# Patient Record
Sex: Female | Born: 1995 | Race: Black or African American | Hispanic: No | Marital: Single | Smoking: Never smoker
Health system: Southern US, Community
[De-identification: ages and names within clinical notes are randomized; demographics above are authoritative.]

## PROBLEM LIST (undated history)

## (undated) DIAGNOSIS — J302 Other seasonal allergic rhinitis: Secondary | ICD-10-CM

## (undated) DIAGNOSIS — E669 Obesity, unspecified: Secondary | ICD-10-CM

---
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
## 2017-11-02 ENCOUNTER — Other Ambulatory Visit: Payer: Self-pay

## 2017-11-02 ENCOUNTER — Encounter (HOSPITAL_BASED_OUTPATIENT_CLINIC_OR_DEPARTMENT_OTHER): Payer: Self-pay | Admitting: *Deleted

## 2017-11-02 ENCOUNTER — Emergency Department (HOSPITAL_BASED_OUTPATIENT_CLINIC_OR_DEPARTMENT_OTHER)
Admission: EM | Admit: 2017-11-02 | Discharge: 2017-11-02 | Disposition: A | Discharge status: Home / Self Care | Payer: Self-pay | Attending: Emergency Medicine | Admitting: Emergency Medicine

## 2017-11-02 DIAGNOSIS — R112 Nausea with vomiting, unspecified: Secondary | ICD-10-CM

## 2017-11-02 DIAGNOSIS — Z7722 Contact with and (suspected) exposure to environmental tobacco smoke (acute) (chronic): Secondary | ICD-10-CM | POA: Insufficient documentation

## 2017-11-02 DIAGNOSIS — R197 Diarrhea, unspecified: Secondary | ICD-10-CM

## 2017-11-02 DIAGNOSIS — K529 Noninfective gastroenteritis and colitis, unspecified: Secondary | ICD-10-CM | POA: Insufficient documentation

## 2017-11-02 LAB — URINALYSIS, ROUTINE W REFLEX MICROSCOPIC
BILIRUBIN URINE: NEGATIVE
GLUCOSE, UA: NEGATIVE mg/dL
HGB URINE DIPSTICK: NEGATIVE
Ketones, ur: NEGATIVE mg/dL
NITRITE: POSITIVE — AB
PH: 6 (ref 5.0–8.0)
Protein, ur: 30 mg/dL — AB

## 2017-11-02 LAB — URINALYSIS, MICROSCOPIC (REFLEX)

## 2017-11-02 LAB — PREGNANCY, URINE: Preg Test, Ur: NEGATIVE

## 2017-11-02 MED ORDER — LOPERAMIDE HCL 2 MG PO CAPS: 4.0000 mg | ORAL_CAPSULE | Freq: Every evening | ORAL | 0 refills | Status: DC | PRN

## 2017-11-02 MED ORDER — ONDANSETRON 8 MG PO TBDP
8.0000 mg | ORAL_TABLET | Freq: Once | ORAL | Status: AC
  Administered 2017-11-02: 8 mg via ORAL
  Filled 2017-11-02: qty 1

## 2017-11-02 MED ORDER — ONDANSETRON 4 MG PO TBDP: 4.0000 mg | ORAL_TABLET | Freq: Three times a day (TID) | ORAL | 0 refills | Status: DC | PRN

## 2017-11-02 NOTE — ED Notes (Signed)
C/o NVD, onset 2030, no sick contacts, no known event. Vomiting yellow bile at this time. (Denies fever).   Alert, NAD, calm, interactive, resps e/u, speaking in clear complete sentences, no dyspnea noted, skin W&D, VSS, (denies: pain, sob, dizziness or visual changes). [facility]

## 2017-11-02 NOTE — ED Provider Notes (Signed)
[facility] EMERGENCY DEPARTMENT Provider Note   CSN: [id number] Arrival date & time: 11/02/17  [id number]     History   Chief Complaint Chief Complaint  Patient presents with  . Emesis    HPI Chelsea Newton is a 22 y.o. female.  HPI 22 year old with history of seasonal allergies comes in with chief complaint of diarrhea and vomiting. Patient reports that her symptoms started at 7:30 PM.  Patient has had about 5 episodes of emesis, bilious in nature.  Patient has no associated abdominal pain.  Patient also has had 2 episodes of loose and watery bowel movements.  Patient denies any associated fevers, chills.  UA shows nitrite positive urine with positive WBCs.  Patient denies any dysuria, hematuria, urinary frequency, lower back pain, suprapubic pain, vaginal discharge.  Patient also is not pregnant, and she does not have a history of UTI.  Past Medical History:  Diagnosis Date  . Seasonal allergies     There are no active problems to display for this patient.   History reviewed. No pertinent surgical history.  OB History    No data available       Home Medications    Prior to Admission medications   Medication Sig Start Date End Date Taking? Authorizing Provider  Cetirizine HCl (ZYRTEC PO) Take by mouth.    [provider]  Docusate Sodium 50 MG/15ML LIQD Take as directed on packaging 12/17/14   Gerhard MunchLockwood, Robert, MD  glycerin, Pediatric, 1.2 G SUPP Place 1 suppository (1.2 g total) rectally daily as needed for moderate constipation. 12/17/14   Gerhard MunchLockwood, Robert, MD  ibuprofen (ADVIL,MOTRIN) 600 MG tablet Take 1 tablet (600 mg total) by mouth every 6 (six) hours as needed. 08/31/15   Geoffery Lyonselo, Douglas, MD  loperamide (IMODIUM) 2 MG capsule Take 2 capsules (4 mg total) by mouth at bedtime as needed for diarrhea or loose stools. 11/02/17   Derwood KaplanNanavati, Milam Allbaugh, MD  ondansetron (ZOFRAN ODT) 4 MG disintegrating tablet Take 1 tablet (4 mg total) by mouth every 8 (eight)  hours as needed for nausea or vomiting. 11/02/17   Derwood KaplanNanavati, Jaime Grizzell, MD    Family History History reviewed. No pertinent family history.  Social History Social History   Tobacco Use  . Smoking status: Passive Smoke Exposure - Never Smoker  . Smokeless tobacco: Never Used  Substance Use Topics  . Alcohol use: No  . Drug use: No     Allergies   Patient has no known allergies.   Review of Systems Review of Systems  Constitutional: Negative for fever.  Gastrointestinal: Positive for diarrhea, nausea and vomiting. Negative for abdominal pain.  Allergic/Immunologic: Negative for immunocompromised state.  Neurological: Positive for light-headedness.  All other systems reviewed and are negative.    Physical Exam Updated Vital Signs BP 133/76 (BP Location: Right Arm)   Pulse 98   Temp 98.3 F (36.8 C) (Oral)   Resp 16   Ht 5\' 11"  (1.803 m)   Wt 136.1 kg (300 lb)   LMP 10/14/2017 (Approximate)   SpO2 100%   BMI 41.84 kg/m   Physical Exam  Constitutional: She is oriented to person, place, and time. She appears well-developed.  HENT:  Head: Normocephalic and atraumatic.  Mucous membranes are moist  Eyes: EOM are normal. No scleral icterus.  Neck: Normal range of motion. Neck supple.  Cardiovascular: Normal rate.  Pulmonary/Chest: Effort normal.  Abdominal: Soft. Bowel sounds are normal. There is no tenderness.  Neurological: She is alert and oriented to person,  place, and time.  Skin: Skin is warm and dry.  Nursing note and vitals reviewed.    ED Treatments / Results  Labs (all labs ordered are listed, but only abnormal results are displayed) Labs Reviewed  URINALYSIS, ROUTINE W REFLEX MICROSCOPIC - Abnormal; Notable for the following components:      Result Value   Specific Gravity, Urine >1.030 (*)    Protein, ur 30 (*)    Nitrite POSITIVE (*)    Leukocytes, UA TRACE (*)    All other components within normal limits  URINALYSIS, MICROSCOPIC (REFLEX) -  Abnormal; Notable for the following components:   Bacteria, UA MANY (*)    Squamous Epithelial / LPF 0-5 (*)    All other components within normal limits  PREGNANCY, URINE    EKG  EKG Interpretation None       Radiology No results found.  Procedures Procedures (including critical care time)  Medications Ordered in ED Medications  ondansetron (ZOFRAN-ODT) disintegrating tablet 8 mg (8 mg Oral Given 11/02/17 0742)     Initial Impression / Assessment and Plan / ED Course  I have reviewed the triage vital signs and the nursing notes.  Pertinent labs & imaging results that were available during my care of the patient were reviewed by me and considered in my medical decision making (see chart for details).  Clinical Course as of Nov 02 910  Wynelle Link Nov 02, 2017  1191 Pt reassessed. Pt's VSS and WNL. Pt's cap refill < 3 seconds. Pt has been hydrated in the ER and now passed po challenge. She only attempted to drink 2-3 sips. We will discharge with antiemetic. Strict ER return precautions have been discussed and pt will return if he is unable to tolerate fluids and symptoms are getting worse.   [AN]    Clinical Course User Index [AN] Derwood Kaplan, MD    Patient comes in with chief complaint of vomiting, diarrhea.  Patient has no associated abdominal pain.   Differential diagnosis includes UTI, small bowel obstruction, gastroenteritis, cholelithiasis, small bowel obstruction, pyelonephritis.  Patient is not appearing severely dehydrated.  There is no abdominal tenderness appreciated on exam.  We will start oral challenge.  Patient's urine also shows positive WBCs and nitrites.  However patient does not have any UTI-like symptoms, therefore we will not treat her for asymptomatic bacteriuria.  Strict return precautions for UTI/pyelonephritis have been discussed.  Final Clinical Impressions(s) / ED Diagnoses   Final diagnoses:  Nausea vomiting and diarrhea  Gastroenteritis     ED Discharge Orders        Ordered    ondansetron (ZOFRAN ODT) 4 MG disintegrating tablet  Every 8 hours PRN     11/02/17 0851    loperamide (IMODIUM) 2 MG capsule  At bedtime PRN     11/02/17 0851       Derwood Kaplan, MD 11/02/17 4782

## 2017-11-02 NOTE — Discharge Instructions (Signed)
Please hydrate well. Clear liquid diet recommended for the first 2 days, followed by soft diet with past and soup.  Start solid food in 4 days, if you are tolerating the clear liquid and a soft diet.  Take nausea medicines as prescribed, take diarrhea medicine only at nighttime if you are having nighttime diarrhea. Please return to the ER if your symptoms worsen; you have increased pain, fevers, chills, inability to keep any medications down, confusion.  As discussed, urine does show some signs of infection, however you have no urinary tract infection-like symptoms, therefore we will not be treating you for UTI.  However if you start having burning with urination, blood in the urine, or frequent urination, lower quadrant abdominal pain or back pain come back to the emergency room.

## 2017-11-03 ENCOUNTER — Emergency Department (HOSPITAL_BASED_OUTPATIENT_CLINIC_OR_DEPARTMENT_OTHER)
Admission: EM | Admit: 2017-11-03 | Discharge: 2017-11-03 | Disposition: A | Discharge status: Home / Self Care | Payer: Self-pay | Attending: Emergency Medicine | Admitting: Emergency Medicine

## 2017-11-03 ENCOUNTER — Other Ambulatory Visit: Payer: Self-pay

## 2017-11-03 ENCOUNTER — Encounter (HOSPITAL_BASED_OUTPATIENT_CLINIC_OR_DEPARTMENT_OTHER): Payer: Self-pay

## 2017-11-03 DIAGNOSIS — R112 Nausea with vomiting, unspecified: Secondary | ICD-10-CM | POA: Insufficient documentation

## 2017-11-03 DIAGNOSIS — Z5321 Procedure and treatment not carried out due to patient leaving prior to being seen by health care provider: Secondary | ICD-10-CM | POA: Insufficient documentation

## 2017-11-03 NOTE — ED Triage Notes (Signed)
C/o abd pain n/v/d started 2 days ago-seen here yesterday for same-NAD-steady gait

## 2018-02-16 ENCOUNTER — Emergency Department (HOSPITAL_BASED_OUTPATIENT_CLINIC_OR_DEPARTMENT_OTHER)
Admission: EM | Admit: 2018-02-16 | Discharge: 2018-02-16 | Disposition: A | Discharge status: Home / Self Care | Payer: Self-pay | Attending: Emergency Medicine | Admitting: Emergency Medicine

## 2018-02-16 ENCOUNTER — Other Ambulatory Visit: Payer: Self-pay

## 2018-02-16 ENCOUNTER — Encounter (HOSPITAL_BASED_OUTPATIENT_CLINIC_OR_DEPARTMENT_OTHER): Payer: Self-pay | Admitting: Emergency Medicine

## 2018-02-16 DIAGNOSIS — B9789 Other viral agents as the cause of diseases classified elsewhere: Secondary | ICD-10-CM | POA: Insufficient documentation

## 2018-02-16 DIAGNOSIS — J069 Acute upper respiratory infection, unspecified: Secondary | ICD-10-CM | POA: Insufficient documentation

## 2018-02-16 LAB — RAPID STREP SCREEN (MED CTR MEBANE ONLY): STREPTOCOCCUS, GROUP A SCREEN (DIRECT): NEGATIVE

## 2018-02-16 MED ORDER — ACETAMINOPHEN 325 MG PO TABS
650.0000 mg | ORAL_TABLET | Freq: Once | ORAL | Status: AC
  Administered 2018-02-16: 650 mg via ORAL

## 2018-02-16 MED ORDER — ACETAMINOPHEN 325 MG PO TABS
ORAL_TABLET | ORAL | Status: AC
  Filled 2018-02-16: qty 2

## 2018-02-16 NOTE — ED Notes (Signed)
ED Provider at bedside. 

## 2018-02-16 NOTE — ED Triage Notes (Signed)
Pt c/o URI symptoms ongoing for a few days. Denies fever.

## 2018-02-16 NOTE — ED Provider Notes (Signed)
[facility] EMERGENCY DEPARTMENT Provider Note   CSN: [id number] Arrival date & time: 02/16/18  1751     History   Chief Complaint Chief Complaint  Patient presents with  . URI  . Sore Throat    HPI Chelsea Newton is a 22 y.o. female.  The history is provided by the patient and medical records. No language interpreter was used.  URI   Associated symptoms include congestion, sore throat and cough. Pertinent negatives include no chest pain, no abdominal pain, no diarrhea, no nausea, no vomiting and no ear pain.  Sore Throat  Pertinent negatives include no chest pain, no abdominal pain and no shortness of breath.  Chelsea Newton is a 22 y.o. female  with a PMH of seasonal allergies who presents to the Emergency Department complaining of persistent sore throat, nasal congestion and intermittent dry cough x 3 days. She has tried Tylenol cold and flu as well as cough lozenges with some improvement. No aggravating factors noted. Denies sick contacts, but does report [place] a friend off at the airport and believes she caught something while inside. No fever, chills, shortness of breath.    Past Medical History:  Diagnosis Date  . Seasonal allergies     There are no active problems to display for this patient.   History reviewed. No pertinent surgical history.   OB History   None      Home Medications    Prior to Admission medications   Medication Sig Start Date End Date Taking? Authorizing Provider  Cetirizine HCl (ZYRTEC PO) Take by mouth.    [provider]  Docusate Sodium 50 MG/15ML LIQD Take as directed on packaging 12/17/14   Gerhard Munch, MD  glycerin, Pediatric, 1.2 G SUPP Place 1 suppository (1.2 g total) rectally daily as needed for moderate constipation. 12/17/14   Gerhard Munch, MD  ibuprofen (ADVIL,MOTRIN) 600 MG tablet Take 1 tablet (600 mg total) by mouth every 6 (six) hours as needed. 08/31/15   Geoffery Lyons, MD  loperamide  (IMODIUM) 2 MG capsule Take 2 capsules (4 mg total) by mouth at bedtime as needed for diarrhea or loose stools. 11/02/17   Derwood Kaplan, MD  ondansetron (ZOFRAN ODT) 4 MG disintegrating tablet Take 1 tablet (4 mg total) by mouth every 8 (eight) hours as needed for nausea or vomiting. 11/02/17   Derwood Kaplan, MD    Family History No family history on file.  Social History Social History   Tobacco Use  . Smoking status: Never Smoker  . Smokeless tobacco: Never Used  Substance Use Topics  . Alcohol use: No  . Drug use: No     Allergies   Patient has no known allergies.   Review of Systems Review of Systems  Constitutional: Negative for chills and fever.  HENT: Positive for congestion and sore throat. Negative for ear discharge, ear pain and trouble swallowing.   Respiratory: Positive for cough. Negative for shortness of breath.   Cardiovascular: Negative for chest pain.  Gastrointestinal: Negative for abdominal pain, diarrhea, nausea and vomiting.     Physical Exam Updated Vital Signs BP (!) 142/99   Pulse 99   Temp 99.1 F (37.3 C) (Oral)   Resp 16   Ht 5\' 11"  (1.803 m)   Wt (!) 138.3 kg (305 lb)   LMP 01/30/2018   SpO2 99%   BMI 42.54 kg/m   Physical Exam  Constitutional: She is oriented to person, place, and time. She appears well-developed and well-nourished. No  distress.  HENT:  Head: Normocephalic and atraumatic.  OP with erythema, no exudates or tonsillar hypertrophy. + nasal congestion with mucosal edema.   Neck: Normal range of motion. Neck supple.  No meningeal signs.   Cardiovascular: Normal rate, regular rhythm and normal heart sounds.  Pulmonary/Chest: Effort normal.  Lungs are clear to auscultation bilaterally - no w/r/r  Abdominal: Soft. She exhibits no distension. There is no tenderness.  Musculoskeletal: Normal range of motion.  Neurological: She is alert and oriented to person, place, and time.  Skin: Skin is warm and dry. She is not  diaphoretic.  Nursing note and vitals reviewed.    ED Treatments / Results  Labs (all labs ordered are listed, but only abnormal results are displayed) Labs Reviewed  RAPID STREP SCREEN ([facility])  CULTURE, GROUP A STREP [facility])    EKG None  Radiology No results found.  Procedures Procedures (including critical care time)  Medications Ordered in ED Medications  acetaminophen (TYLENOL) 325 MG tablet (has no administration in time range)  acetaminophen (TYLENOL) tablet 650 mg (650 mg Oral Given 02/16/18 1851)     Initial Impression / Assessment and Plan / ED Course  I have reviewed the triage vital signs and the nursing notes.  Pertinent labs & imaging results that were available during my care of the patient were reviewed by me and considered in my medical decision making (see chart for details).     Chelsea GivensDarricka Newton is a 22 y.o. female who presents to ED for cough, congestion, sore throat.   On exam, patient is afebrile, non-toxic appearing with a clear lung exam. Mild rhinorrhea and OP with erythema but no exudates or tonsillar hypertrophy.  Rapid strep negative.  Sxs today likely due to viral URI.Symptomatic home care instructions discussed. PCP follow up strongly encouraged if symptoms persist. Reasons to return to ER discussed. All questions answered.   Blood pressure (!) 142/99, pulse 99, temperature 99.1 F (37.3 C), temperature source Oral, resp. rate 16, height 5\' 11"  (1.803 m), weight (!) 138.3 kg (305 lb), last menstrual period 01/30/2018, SpO2 99 %.   Final Clinical Impressions(s) / ED Diagnoses   Final diagnoses:  Viral URI with cough    ED Discharge Orders    None       Ward, Chase PicketJaime Pilcher, PA-C 02/16/18 1916    Pricilla LovelessGoldston, Scott, MD 02/17/18 1004

## 2018-02-16 NOTE — Discharge Instructions (Signed)
It was my pleasure taking care of you today!   Your symptoms are likely due to a viral upper respiratory infection. Fortunately, we did not see evidence of serious infection and can treat your symptoms. Flonase and mucinex for nasal congestion. Alternate between Tylenol and ibuprofen as needed for body aches / fevers.   Rest, drink plenty of fluids to be sure you are staying hydrated.   Please follow up with your primary doctor for discussion of your diagnoses and further evaluation after today's visit if symptoms persist longer than 7 days; Return to the ER for high fevers, difficulty breathing or other concerning symptoms

## 2018-02-19 LAB — CULTURE, GROUP A STREP (THRC)

## 2020-04-07 ENCOUNTER — Emergency Department (HOSPITAL_BASED_OUTPATIENT_CLINIC_OR_DEPARTMENT_OTHER)
Admission: EM | Admit: 2020-04-07 | Discharge: 2020-04-07 | Disposition: A | Discharge status: Home / Self Care | Payer: Self-pay | Attending: Emergency Medicine | Admitting: Emergency Medicine

## 2020-04-07 ENCOUNTER — Other Ambulatory Visit: Payer: Self-pay

## 2020-04-07 ENCOUNTER — Emergency Department (HOSPITAL_BASED_OUTPATIENT_CLINIC_OR_DEPARTMENT_OTHER): Payer: Self-pay

## 2020-04-07 ENCOUNTER — Encounter (HOSPITAL_BASED_OUTPATIENT_CLINIC_OR_DEPARTMENT_OTHER): Payer: Self-pay | Admitting: Emergency Medicine

## 2020-04-07 DIAGNOSIS — M546 Pain in thoracic spine: Secondary | ICD-10-CM | POA: Insufficient documentation

## 2020-04-07 DIAGNOSIS — R109 Unspecified abdominal pain: Secondary | ICD-10-CM | POA: Insufficient documentation

## 2020-04-07 DIAGNOSIS — E669 Obesity, unspecified: Secondary | ICD-10-CM | POA: Insufficient documentation

## 2020-04-07 DIAGNOSIS — M545 Low back pain: Secondary | ICD-10-CM | POA: Insufficient documentation

## 2020-04-07 DIAGNOSIS — R0602 Shortness of breath: Secondary | ICD-10-CM | POA: Insufficient documentation

## 2020-04-07 HISTORY — DX: Obesity, unspecified: E66.9

## 2020-04-07 LAB — COMPREHENSIVE METABOLIC PANEL
ALT: 21 U/L (ref 0–44)
AST: 18 U/L (ref 15–41)
Albumin: 3.8 g/dL (ref 3.5–5.0)
Alkaline Phosphatase: 69 U/L (ref 38–126)
Anion gap: 8 (ref 5–15)
BUN: 9 mg/dL (ref 6–20)
CO2: 25 mmol/L (ref 22–32)
Calcium: 8.6 mg/dL — ABNORMAL LOW (ref 8.9–10.3)
Chloride: 105 mmol/L (ref 98–111)
Creatinine, Ser: 0.86 mg/dL (ref 0.44–1.00)
GFR calc Af Amer: >60 mL/min (ref >60)
GFR calc non Af Amer: >60 mL/min (ref >60)
Glucose, Bld: 101 mg/dL — ABNORMAL HIGH (ref 70–99)
Potassium: 3.7 mmol/L (ref 3.5–5.1)
Sodium: 138 mmol/L (ref 135–145)
Total Bilirubin: 0.8 mg/dL (ref 0.3–1.2)
Total Protein: 7.5 g/dL (ref 6.5–8.1)

## 2020-04-07 LAB — URINALYSIS, ROUTINE W REFLEX MICROSCOPIC
Bilirubin Urine: NEGATIVE
Glucose, UA: NEGATIVE mg/dL
Hgb urine dipstick: NEGATIVE
Ketones, ur: NEGATIVE mg/dL
Leukocytes,Ua: NEGATIVE
Nitrite: NEGATIVE
Protein, ur: NEGATIVE mg/dL
Specific Gravity, Urine: >1.03 — ABNORMAL HIGH (ref 1.005–1.030)
pH: 6 (ref 5.0–8.0)

## 2020-04-07 LAB — PREGNANCY, URINE: Preg Test, Ur: NEGATIVE

## 2020-04-07 LAB — CBC WITH DIFFERENTIAL/PLATELET
Abs Immature Granulocytes: 0.02 10*3/uL (ref 0.00–0.07)
Basophils Absolute: 0 10*3/uL (ref 0.0–0.1)
Basophils Relative: 0 %
Eosinophils Absolute: 0 10*3/uL (ref 0.0–0.5)
Eosinophils Relative: 1 %
HCT: 37.3 % (ref 36.0–46.0)
Hemoglobin: 12 g/dL (ref 12.0–15.0)
Immature Granulocytes: 0 %
Lymphocytes Relative: 31 %
Lymphs Abs: 2 10*3/uL (ref 0.7–4.0)
MCH: 27.6 pg (ref 26.0–34.0)
MCHC: 32.2 g/dL (ref 30.0–36.0)
MCV: 85.7 fL (ref 80.0–100.0)
Monocytes Absolute: 0.5 10*3/uL (ref 0.1–1.0)
Monocytes Relative: 8 %
Neutro Abs: 3.9 10*3/uL (ref 1.7–7.7)
Neutrophils Relative %: 60 %
Platelets: 286 10*3/uL (ref 150–400)
RBC: 4.35 MIL/uL (ref 3.87–5.11)
RDW: 14.2 % (ref 11.5–15.5)
WBC: 6.5 10*3/uL (ref 4.0–10.5)
nRBC: 0 % (ref 0.0–0.2)

## 2020-04-07 LAB — TROPONIN I (HIGH SENSITIVITY): Troponin I (High Sensitivity): 2 ng/L (ref <18)

## 2020-04-07 LAB — D-DIMER, QUANTITATIVE: D-Dimer, Quant: <0.27 ug/mL-FEU (ref 0.00–0.50)

## 2020-04-07 LAB — LIPASE, BLOOD: Lipase: 21 U/L (ref 11–51)

## 2020-04-07 MED ORDER — HYDROMORPHONE HCL 1 MG/ML IJ SOLN
1.0000 mg | Freq: Once | INTRAMUSCULAR | Status: AC
Start: 2020-04-07 — End: 2020-04-07
  Administered 2020-04-07: 1 mg via INTRAVENOUS
  Filled 2020-04-07: qty 1

## 2020-04-07 MED ORDER — SODIUM CHLORIDE 0.9 % IV BOLUS
1000.0000 mL | Freq: Once | INTRAVENOUS | Status: AC
  Administered 2020-04-07: 1000 mL via INTRAVENOUS

## 2020-04-07 NOTE — ED Provider Notes (Signed)
[facility] EMERGENCY DEPARTMENT Provider Note   CSN: [id number] Arrival date & time: 04/07/20  0736     History Chief Complaint  Patient presents with  . Chest and right flank pain    Chelsea Newton is a 24 y.o. female.  HPI 24 year old female presents with chest pain and right flank pain.  Both of these started on 6/8.  Waxing and waning pain since.  They both started together but do not seem to be coming back together.  The chest pain feels like a sensation of caving in in the middle of her chest.  Since yesterday the chest pain has not recurred.  However the flank/side pain is still present and woke her up out of sleep.  She has not take anything for the pain.  It does not radiate.  This feels more like a severe cramp.  No vomiting.  Sometimes it feels like she has a hard time catching her breath but no real pleuritic pain.  No fevers or abdominal pain.  No diarrhea or urinary symptoms.  Last menstrual cycle in the middle of May. No calf swelling or pain.  Past Medical History:  Diagnosis Date  . Obesity   . Seasonal allergies     There are no problems to display for this patient.   History reviewed. No pertinent surgical history.   OB History   No obstetric history on file.     No family history on file.  Social History   Tobacco Use  . Smoking status: Never Smoker  . Smokeless tobacco: Never Used  Substance Use Topics  . Alcohol use: No  . Drug use: No    Home Medications Prior to Admission medications   Not on File    Allergies    Patient has no known allergies.  Review of Systems   Review of Systems  Constitutional: Negative for fever.  Respiratory: Positive for shortness of breath.   Cardiovascular: Positive for chest pain.  Gastrointestinal: Negative for abdominal pain, diarrhea and vomiting.  Genitourinary: Positive for flank pain. Negative for dysuria and hematuria.  Musculoskeletal: Positive for back pain.  All other systems  reviewed and are negative.   Physical Exam Updated Vital Signs BP 114/74 (BP Location: Left Arm)   Pulse 85   Temp 98 F (36.7 C)   Resp 16   Ht 5\' 11"  (1.803 m)   Wt (!) 141.9 kg   LMP 03/13/2020   SpO2 100%   BMI 43.63 kg/m   Physical Exam Vitals and nursing note reviewed.  Constitutional:      Appearance: She is well-developed. She is obese.  HENT:     Head: Normocephalic and atraumatic.     Right Ear: External ear normal.     Left Ear: External ear normal.     Nose: Nose normal.  Eyes:     General:        Right eye: No discharge.        Left eye: No discharge.  Cardiovascular:     Rate and Rhythm: Normal rate and regular rhythm.     Heart sounds: Normal heart sounds.  Pulmonary:     Effort: Pulmonary effort is normal.     Breath sounds: Normal breath sounds.  Abdominal:     Palpations: Abdomen is soft.     Tenderness: There is no abdominal tenderness.  Musculoskeletal:     Thoracic back: Tenderness present.       Back:     Comments: Diffuse  tenderness near CVA. No rash/skin changes  Skin:    General: Skin is warm and dry.  Neurological:     Mental Status: She is alert.  Psychiatric:        Mood and Affect: Mood is not anxious.     ED Results / Procedures / Treatments   Labs (all labs ordered are listed, but only abnormal results are displayed) Labs Reviewed  URINALYSIS, ROUTINE W REFLEX MICROSCOPIC - Abnormal; Notable for the following components:      Result Value   APPearance HAZY (*)    Specific Gravity, Urine >1.030 (*)    All other components within normal limits  COMPREHENSIVE METABOLIC PANEL - Abnormal; Notable for the following components:   Glucose, Bld 101 (*)    Calcium 8.6 (*)    All other components within normal limits  PREGNANCY, URINE  LIPASE, BLOOD  CBC WITH DIFFERENTIAL/PLATELET  D-DIMER, QUANTITATIVE (NOT AT [facility])  TROPONIN I (HIGH SENSITIVITY)    EKG EKG Interpretation  Date/Time:  Friday April 07 2020 07:58:59  EDT Ventricular Rate:  77 PR Interval:    QRS Duration: 95 QT Interval:  383 QTC Calculation: 434 R Axis:   47 Text Interpretation: Sinus rhythm no acute ST/T changes No old tracing to compare Confirmed by Sherwood Gambler [phone]) on 04/07/2020 8:11:40 AM   Radiology DG Chest 2 View  Result Date: 04/07/2020 CLINICAL DATA:  24 year old female with history of chest pain and right-sided flank pain. Shortness of breath. EXAM: CHEST - 2 VIEW COMPARISON:  Chest x-ray 05/25/2018. FINDINGS: Lung volumes are normal. No consolidative airspace disease. No pleural effusions. No pneumothorax. No pulmonary nodule or mass noted. Pulmonary vasculature and the cardiomediastinal silhouette are within normal limits. IMPRESSION: No radiographic evidence of acute cardiopulmonary disease. Electronically Signed   By: Vinnie Langton M.D.   On: 04/07/2020 08:54    Procedures Procedures (including critical care time)  Medications Ordered in ED Medications  sodium chloride 0.9 % bolus 1,000 mL ( Intravenous Stopped 04/07/20 0952)  HYDROmorphone (DILAUDID) injection 1 mg (1 mg Intravenous Given 04/07/20 0843)    ED Course  I have reviewed the triage vital signs and the nursing notes.  Pertinent labs & imaging results that were available during my care of the patient were reviewed by me and considered in my medical decision making (see chart for details).    MDM Rules/Calculators/A&P                          Patient has some mild right-sided back tenderness.  Otherwise her exam is fairly unremarkable.  Given the location, in between thoracic and abdominal, broad work-up obtained including troponin, D-dimer, labs and urine.  These are all unremarkable.  There is no blood in her urine.  There is no radicular pain and I think ureteral colic is unlikely.  Low risk for PE and with negative D-dimer I think this is unlikely.  Doubt ACS.  Probably given positional changes and point tenderness this is a muscular problem.   Advised NSAIDs and supportive care.  I think given low suspicion for an acute intrathoracic or intra-abdominal emergency, I do not think CTs are warranted at this time but if her pain worsens or does not improve this may be indicated in the future. Final Clinical Impression(s) / ED Diagnoses Final diagnoses:  Acute right-sided thoracic back pain    Rx / DC Orders ED Discharge Orders    None  Pricilla Loveless, MD 04/07/20 1444

## 2020-04-07 NOTE — Discharge Instructions (Signed)
If you develop worsening, recurrent, or continued back pain, numbness or weakness in the legs, incontinence of your bowels or bladders, numbness of your buttocks, fever, abdominal pain, or any other new/concerning symptoms then return to the ER for evaluation.  

## 2020-04-07 NOTE — ED Triage Notes (Signed)
For the past 3 days pt has been having intermittent Chest pain, SOB, and Right flank pain.  Denies any urinary sx.

## 2020-04-07 NOTE — ED Notes (Signed)
Pt unable to urine at this time. Is aware that we need a urine sample.

## 2021-03-14 ENCOUNTER — Other Ambulatory Visit: Payer: Self-pay

## 2021-03-14 ENCOUNTER — Emergency Department (HOSPITAL_BASED_OUTPATIENT_CLINIC_OR_DEPARTMENT_OTHER)
Admission: EM | Admit: 2021-03-14 | Discharge: 2021-03-15 | Disposition: A | Discharge status: Home / Self Care | Payer: Medicaid Other | Attending: Emergency Medicine | Admitting: Emergency Medicine

## 2021-03-14 ENCOUNTER — Encounter (HOSPITAL_BASED_OUTPATIENT_CLINIC_OR_DEPARTMENT_OTHER): Payer: Self-pay | Admitting: *Deleted

## 2021-03-14 DIAGNOSIS — R21 Rash and other nonspecific skin eruption: Secondary | ICD-10-CM | POA: Insufficient documentation

## 2021-03-14 DIAGNOSIS — M25532 Pain in left wrist: Secondary | ICD-10-CM | POA: Insufficient documentation

## 2021-03-14 DIAGNOSIS — Z5321 Procedure and treatment not carried out due to patient leaving prior to being seen by health care provider: Secondary | ICD-10-CM | POA: Insufficient documentation

## 2021-03-14 NOTE — ED Triage Notes (Signed)
C/o left wrist pain x 1 day , denies injury , also c/o  Peeling rash to hands and feet

## 2021-04-26 ENCOUNTER — Encounter (HOSPITAL_BASED_OUTPATIENT_CLINIC_OR_DEPARTMENT_OTHER): Payer: Self-pay

## 2021-04-26 ENCOUNTER — Other Ambulatory Visit (HOSPITAL_BASED_OUTPATIENT_CLINIC_OR_DEPARTMENT_OTHER): Payer: Self-pay

## 2021-04-26 ENCOUNTER — Other Ambulatory Visit: Payer: Self-pay

## 2021-04-26 ENCOUNTER — Emergency Department (HOSPITAL_BASED_OUTPATIENT_CLINIC_OR_DEPARTMENT_OTHER)
Admission: EM | Admit: 2021-04-26 | Discharge: 2021-04-26 | Disposition: A | Discharge status: Home / Self Care | Payer: Medicaid Other | Attending: Emergency Medicine | Admitting: Emergency Medicine

## 2021-04-26 DIAGNOSIS — J029 Acute pharyngitis, unspecified: Secondary | ICD-10-CM | POA: Diagnosis not present

## 2021-04-26 DIAGNOSIS — R0981 Nasal congestion: Secondary | ICD-10-CM

## 2021-04-26 MED ORDER — PREDNISONE 10 MG PO TABS
ORAL_TABLET | ORAL | 0 refills | Status: AC
  Filled 2021-04-26: qty 26, 8d supply, fill #0

## 2021-04-26 MED ORDER — IBUPROFEN 400 MG PO TABS
600.0000 mg | ORAL_TABLET | Freq: Once | ORAL | Status: AC
  Administered 2021-04-26: 600 mg via ORAL
  Filled 2021-04-26: qty 1

## 2021-04-26 NOTE — Discharge Instructions (Addendum)
General Viral Syndrome Care Instructions:  Your symptoms are likely consistent with a viral illness. Viruses do not require or respond to antibiotics. Treatment is symptomatic care and it is important to note that these symptoms may last for 7-14 days.   Hand washing: Wash your hands throughout the day, but especially before and after touching the face, using the restroom, sneezing, coughing, or touching surfaces that have been coughed or sneezed upon. Hydration: Symptoms of most illnesses will be intensified and complicated by dehydration. Dehydration can also extend the duration of symptoms. Drink plenty of fluids and get plenty of rest. You should be drinking at least half a liter of water an hour to stay hydrated. Electrolyte drinks (ex. Gatorade, Powerade, Pedialyte) are also encouraged. You should be drinking enough fluids to make your urine light yellow, almost clear. If this is not the case, you are not drinking enough water. Please note that some of the treatments indicated below will not be effective if you are not adequately hydrated. Diet: Please concentrate on hydration, however, you may introduce food slowly.  Start with a clear liquid diet, progressed to a full liquid diet, and then bland solids as you are able. Pain or fever: Ibuprofen, Naproxen, or acetaminophen (generic for Tylenol) for pain or fever.  Antiinflammatory medications: Take 600 mg of ibuprofen every 6 hours or 440 mg (over the counter dose) to 500 mg (prescription dose) of naproxen every 12 hours for the next 3 days. After this time, these medications may be used as needed for pain. Take these medications with food to avoid upset stomach. Choose only one of these medications, do not take them together. Acetaminophen (generic for Tylenol): Should you continue to have additional pain while taking the ibuprofen or naproxen, you may add in acetaminophen as needed. Your daily total maximum amount of acetaminophen from all sources  should be limited to 4000mg /day for persons without liver problems, or 2000mg /day for those with liver problems. Prednisone: Take the prednisone, as directed, in its entirety. Zyrtec or Claritin: May add these medication daily to control underlying symptoms of congestion, sneezing, and other signs of allergies.  These medications are available over-the-counter. Generics: Cetirizine (generic for Zyrtec) and loratadine (generic for Claritin). Fluticasone: Use fluticasone (generic for Flonase), as directed, for nasal and sinus congestion.  This medication is available over-the-counter. Congestion: Plain guaifenesin (generic for plain Mucinex) may help relieve congestion. Saline sinus rinses and saline nasal sprays may also help relieve congestion. If you do not have high blood pressure, heart problems, or an allergy to such medications, you may also try phenylephrine or Sudafed. Sore throat: Warm liquids or Chloraseptic spray may help soothe a sore throat. Gargle twice a day with a salt water solution made from a half teaspoon of salt in a cup of warm water.  Follow up: Follow up with a primary care provider within the next two weeks should symptoms fail to resolve. Return: Return to the ED for significantly worsening symptoms, shortness of breath, persistent vomiting, large amounts of blood in stool, or any other major concerns.  For prescription assistance, may try using prescription discount sites or apps, such as goodrx.com

## 2021-04-26 NOTE — ED Provider Notes (Signed)
[facility] EMERGENCY DEPARTMENT Provider Note   CSN: [id number] Arrival date & time: 04/26/21  1534     History Chief Complaint  Patient presents with   URI    Chelsea Newton is a 25 y.o. female.  HPI     Chelsea Newton is a 25 y.o. female, with a history of obesity and seasonal allergies, presenting to the ED with sore throat, nasal congestion, body aches, subjective fever for the last 2 days.  She also endorses left wrist pain for about the last month.  She states this pain mostly occurs with radial-ulnar movement of the wrist.  She denies weakness, numbness, injury, swelling, deformity, hand pain, elbow pain.  Patient adds she is 2 months postpartum.  She is not breast-feeding. Denies nausea, vomiting, diarrhea, chest pain, shortness of breath, cough, dizziness, vision changes, neurologic deficits, abdominal pain, urinary symptoms, or any other complaints.  Past Medical History:  Diagnosis Date   Obesity    Seasonal allergies     There are no problems to display for this patient.   History reviewed. No pertinent surgical history.   OB History   No obstetric history on file.     No family history on file.  Social History   Tobacco Use   Smoking status: Never   Smokeless tobacco: Never  Substance Use Topics   Alcohol use: No   Drug use: No    Home Medications Prior to Admission medications   Medication Sig Start Date End Date Taking? Authorizing Provider  predniSONE (DELTASONE) 10 MG tablet Take 4 tablets (40 mg total) by mouth daily for 5 days, THEN 3 tablets (30 mg total) daily for 1 day, THEN 2 tablets (20 mg total) daily for 1 day, THEN 1 tablet (10 mg total) daily for 1 day. 04/26/21 05/04/21 Yes Rehema Muffley C, PA-C    Allergies    Patient has no known allergies.  Review of Systems   Review of Systems  Constitutional:  Positive for fever. Negative for chills, diaphoresis and unexpected weight change.  HENT:  Positive for congestion and  sore throat. Negative for ear pain, trouble swallowing and voice change.   Eyes:  Negative for visual disturbance.  Respiratory:  Negative for cough, chest tightness and shortness of breath.   Cardiovascular:  Negative for chest pain, palpitations and leg swelling.  Gastrointestinal:  Negative for abdominal pain, constipation, diarrhea, nausea and vomiting.  Genitourinary:  Negative for dysuria and flank pain.  Musculoskeletal:  Positive for myalgias. Negative for back pain, neck pain and neck stiffness.  Skin:  Negative for color change, pallor and rash.  Neurological:  Negative for dizziness, syncope, weakness, light-headedness and numbness.  All other systems reviewed and are negative.  Physical Exam Updated Vital Signs BP (!) 158/104 (BP Location: Left Arm)   Pulse (!) 120   Temp 99.4 F (37.4 C) (Oral)   Resp 20   Ht 5\' 11"  (1.803 m)   Wt (!) 141.5 kg   LMP 04/18/2021   SpO2 97%   BMI 43.52 kg/m   Physical Exam Vitals and nursing note reviewed.  Constitutional:      General: She is not in acute distress.    Appearance: She is well-developed. She is obese. She is not diaphoretic.  HENT:     Head: Normocephalic and atraumatic.     Mouth/Throat:     Mouth: Mucous membranes are moist.     Pharynx: Oropharynx is clear.  Eyes:     Conjunctiva/sclera: Conjunctivae  normal.  Cardiovascular:     Rate and Rhythm: Normal rate and regular rhythm.     Pulses: Normal pulses.          Radial pulses are 2+ on the right side and 2+ on the left side.       Posterior tibial pulses are 2+ on the right side and 2+ on the left side.     Heart sounds: Normal heart sounds.     Comments: Tactile temperature in the extremities appropriate and equal bilaterally. Pulmonary:     Effort: Pulmonary effort is normal. No respiratory distress.     Breath sounds: Normal breath sounds.     Comments: No increased work of breathing.  Speaks in full sentences without difficulty. Abdominal:      Palpations: Abdomen is soft.     Tenderness: There is no abdominal tenderness. There is no guarding.  Musculoskeletal:     Cervical back: Normal range of motion and neck supple. No rigidity or tenderness.     Right lower leg: No edema.     Left lower leg: No edema.     Comments: Patient indicates pain over the distal radial right wrist.  She has no point tenderness in this area.  No anatomical snuffbox tenderness.  No swelling, deformity, instability, color abnormality, increased warmth.  She has full range of motion in her wrist, hand, fingers, and elbow, without noted difficulty.  Lymphadenopathy:     Cervical: No cervical adenopathy.  Skin:    General: Skin is warm and dry.  Neurological:     Mental Status: She is alert.     Comments: Sensation grossly intact to light touch through each of the nerve distributions of the bilateral upper extremities. Abduction and adduction of the fingers intact against resistance. Grip strength equal bilaterally. Supination and pronation intact against resistance. Strength 5/5 through the cardinal directions of the bilateral wrists. Strength 5/5 with flexion and extension of the bilateral elbows. Patient can touch the thumb to each one of the fingertips without difficulty.  Patient can hold the "OK" sign against resistance.  Psychiatric:        Mood and Affect: Mood and affect normal.        Speech: Speech normal.        Behavior: Behavior normal.    ED Results / Procedures / Treatments   Labs (all labs ordered are listed, but only abnormal results are displayed) Labs Reviewed - No data to display  EKG None  Radiology No results found.  Procedures Procedures   Medications Ordered in ED Medications  ibuprofen (ADVIL) tablet 600 mg (600 mg Oral Given 04/26/21 1645)    ED Course  I have reviewed the triage vital signs and the nursing notes.  Pertinent labs & imaging results that were available during my care of the patient were reviewed  by me and considered in my medical decision making (see chart for details).    MDM Rules/Calculators/A&P                          Patient presents with sore throat, body aches, nasal congestion. Patient is nontoxic appearing, afebrile, not tachypneic, not hypotensive, maintains excellent SPO2 on room air, and is in no apparent distress.  Mildly tachycardic, 106 on my evaluation.  Patient has not been drinking as much fluid as she should.  She was counseled on this. I have reviewed the patient's chart to obtain more information.  Her wrist pain has been persistent for a month.  She has full range of motion, no swelling, no increased warmth, no tenderness on exam.  My suspicion for septic arthritis is driving the patient's symptoms is quite low. She has no abdominal pain or GYN symptoms, which lowers my suspicion for postpartum infection. Patient was seen in the emergency department for all of the symptoms late last night at the [facility] location. Her work-up included x-rays of the chest and neck, which were unremarkable. Negative lactic acid, no leukocytosis, the remainder of the CBC and CMP, as well as the UA unremarkable.  Pregnancy test yesterday negative. Mononucleosis, influenza, strep, and COVID testing all negative late last night.  Patient requested a brace for her wrist.  She thinks her tonsils are bigger than normal all the time.  She requests referral to ENT.  Her hypertension was noted and discussed with the patient.  Doubt hypertensive emergency.  She is outside the typical window for postpartum preeclampsia. She will need to follow-up with PCP on this matter.  Resources given.   Final Clinical Impression(s) / ED Diagnoses Final diagnoses:  Sore throat  Nasal congestion    Rx / DC Orders ED Discharge Orders          Ordered    predniSONE (DELTASONE) 10 MG tablet        04/26/21 1653             Anselm Pancoast, PA-C 04/26/21 1654    Virgina Norfolk, DO 04/26/21 1802

## 2021-04-26 NOTE — ED Triage Notes (Signed)
Pt c/o URI sx started yesterday-NAD-steady gait 

## 2022-01-20 IMAGING — CR DG CHEST 2V
2 series · 2 of 2 positions shown · non-contrast
Comparison: Chest x-ray 05/25/2018.

CLINICAL DATA: 24-year-old female with history of chest pain and
right-sided flank pain. Shortness of breath.

EXAM:
CHEST - 2 VIEW

[w chest pa]
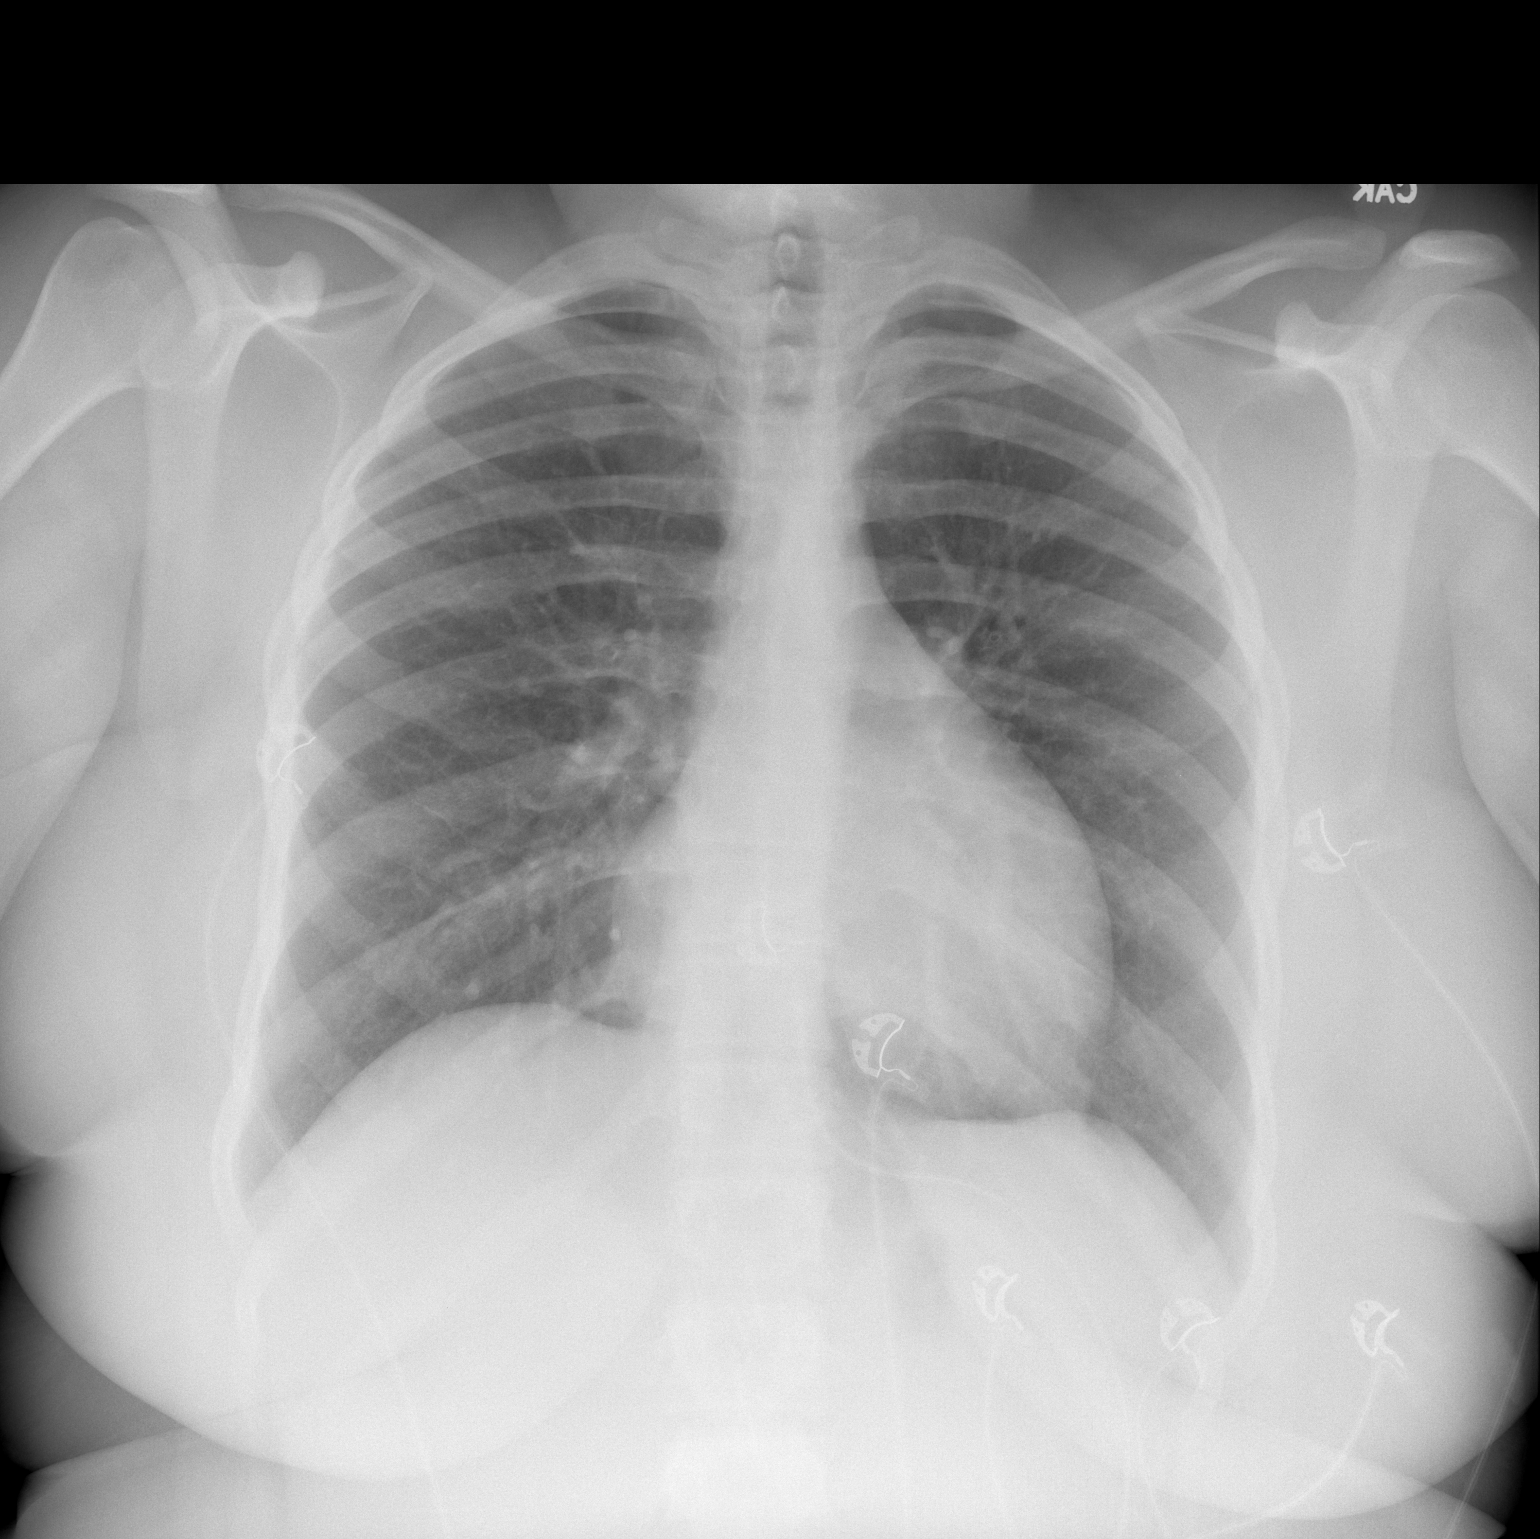

[w chest lat *]
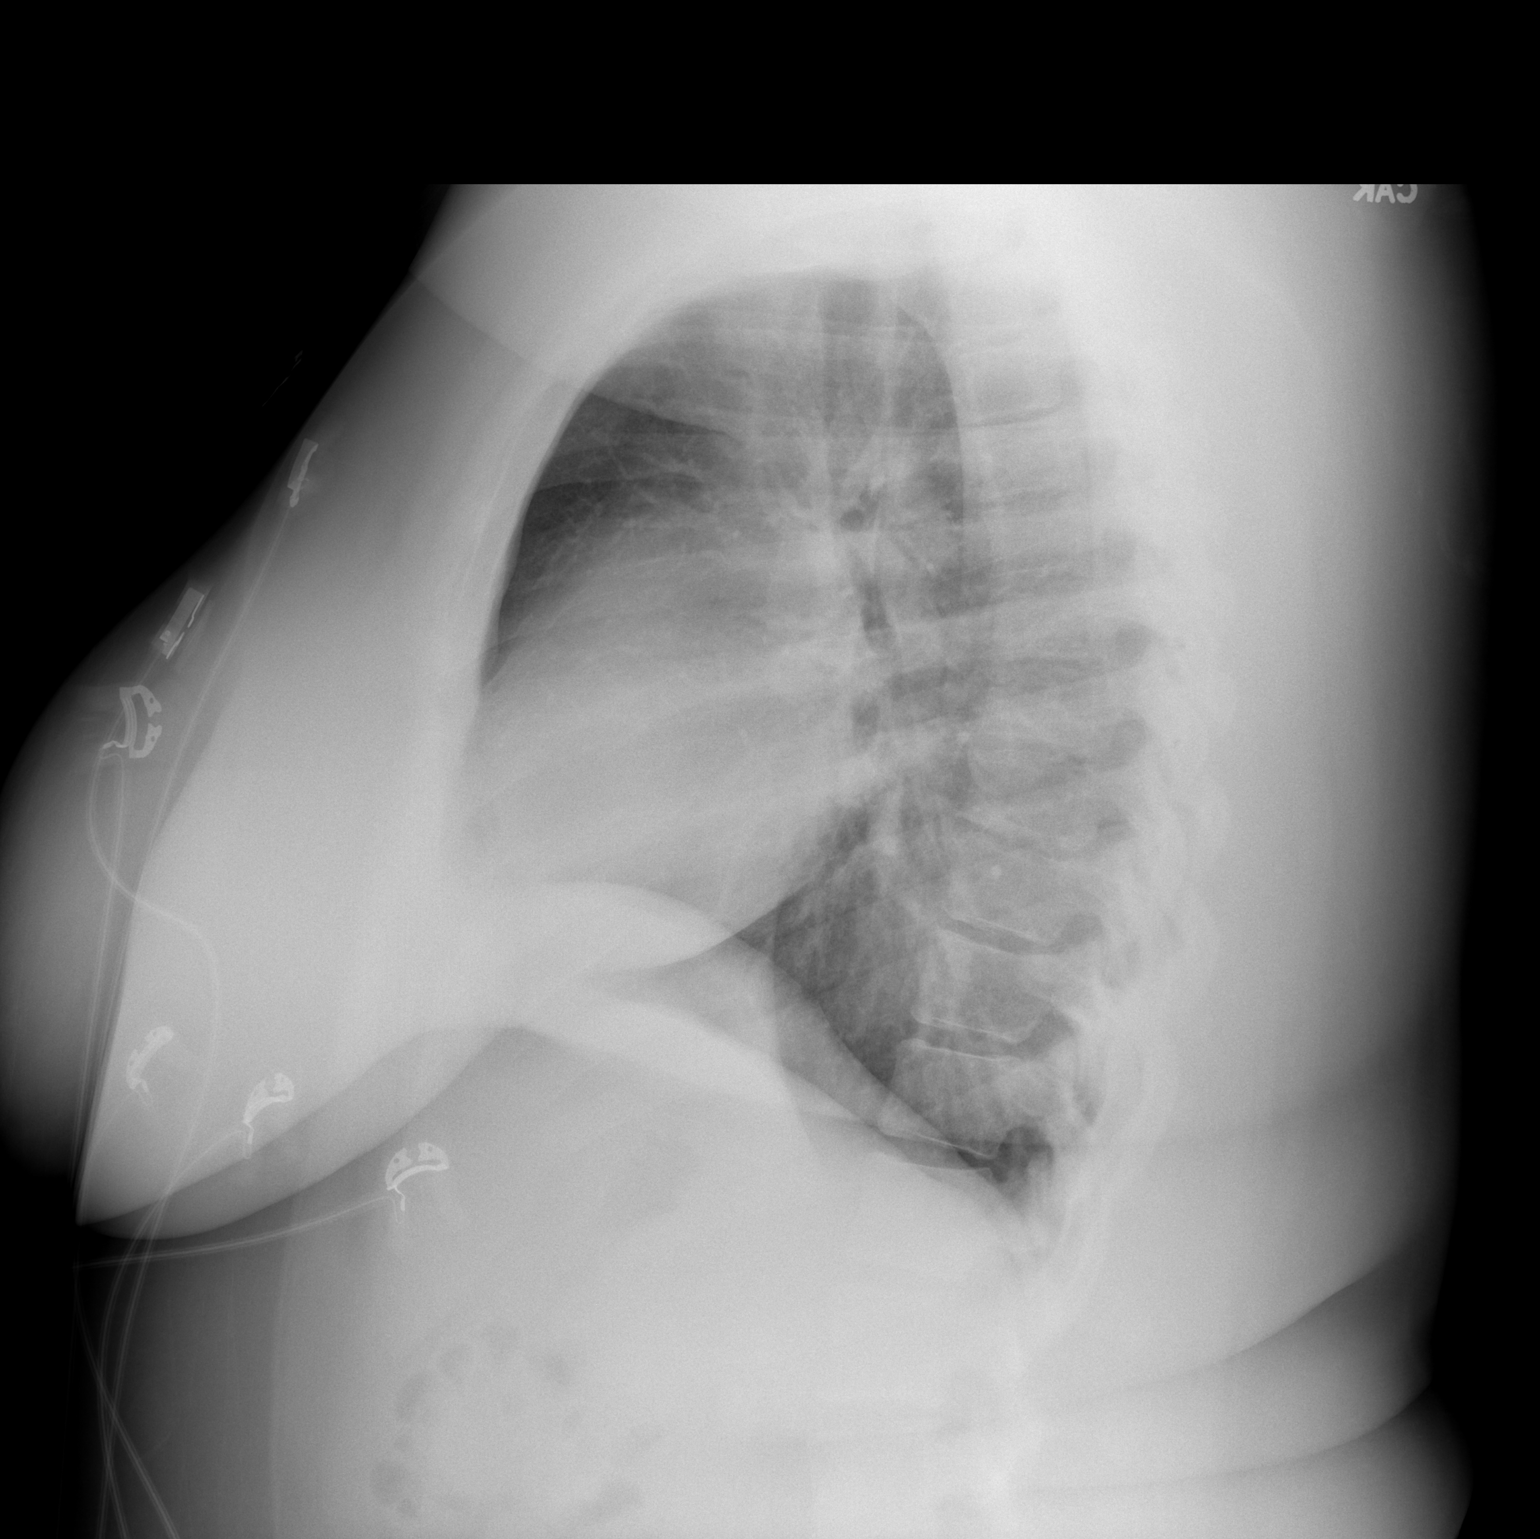

[2 of 2 positions shown; findings below may reference images not displayed]

FINDINGS: Lung volumes are normal. No consolidative airspace disease. No
pleural effusions. No pneumothorax. No pulmonary nodule or mass
noted. Pulmonary vasculature and the cardiomediastinal silhouette
are within normal limits.
IMPRESSION: No radiographic evidence of acute cardiopulmonary disease.

## 2023-01-24 ENCOUNTER — Encounter (HOSPITAL_BASED_OUTPATIENT_CLINIC_OR_DEPARTMENT_OTHER): Payer: Self-pay | Admitting: Emergency Medicine

## 2023-01-24 ENCOUNTER — Other Ambulatory Visit (HOSPITAL_BASED_OUTPATIENT_CLINIC_OR_DEPARTMENT_OTHER): Payer: Self-pay

## 2023-01-24 ENCOUNTER — Other Ambulatory Visit: Payer: Self-pay

## 2023-01-24 ENCOUNTER — Emergency Department (HOSPITAL_BASED_OUTPATIENT_CLINIC_OR_DEPARTMENT_OTHER)
Admission: EM | Admit: 2023-01-24 | Discharge: 2023-01-24 | Disposition: A | Discharge status: Home / Self Care | Payer: Medicaid Other | Attending: Emergency Medicine | Admitting: Emergency Medicine

## 2023-01-24 DIAGNOSIS — J039 Acute tonsillitis, unspecified: Secondary | ICD-10-CM | POA: Insufficient documentation

## 2023-01-24 DIAGNOSIS — Z1152 Encounter for screening for COVID-19: Secondary | ICD-10-CM | POA: Diagnosis not present

## 2023-01-24 DIAGNOSIS — N3001 Acute cystitis with hematuria: Secondary | ICD-10-CM | POA: Insufficient documentation

## 2023-01-24 DIAGNOSIS — K5900 Constipation, unspecified: Secondary | ICD-10-CM | POA: Insufficient documentation

## 2023-01-24 DIAGNOSIS — R300 Dysuria: Secondary | ICD-10-CM | POA: Diagnosis present

## 2023-01-24 LAB — URINALYSIS, ROUTINE W REFLEX MICROSCOPIC
Bilirubin Urine: NEGATIVE
Glucose, UA: 100 mg/dL — AB
Ketones, ur: NEGATIVE mg/dL
Nitrite: POSITIVE — AB
Protein, ur: 100 mg/dL — AB
Specific Gravity, Urine: >=1.03 (ref 1.005–1.030)
pH: 6 (ref 5.0–8.0)

## 2023-01-24 LAB — RESP PANEL BY RT-PCR (RSV, FLU A&B, COVID)  RVPGX2
Influenza A by PCR: NEGATIVE
Influenza B by PCR: NEGATIVE
Resp Syncytial Virus by PCR: NEGATIVE
SARS Coronavirus 2 by RT PCR: NEGATIVE

## 2023-01-24 LAB — URINALYSIS, MICROSCOPIC (REFLEX)

## 2023-01-24 LAB — PREGNANCY, URINE: Preg Test, Ur: NEGATIVE

## 2023-01-24 LAB — GROUP A STREP BY PCR: Group A Strep by PCR: NOT DETECTED

## 2023-01-24 MED ORDER — DEXAMETHASONE 4 MG PO TABS
4.0000 mg | ORAL_TABLET | Freq: Once | ORAL | Status: AC
  Administered 2023-01-24: 4 mg via ORAL
  Filled 2023-01-24: qty 1

## 2023-01-24 MED ORDER — NITROFURANTOIN MONOHYD MACRO 100 MG PO CAPS
100.0000 mg | ORAL_CAPSULE | Freq: Two times a day (BID) | ORAL | 0 refills | Status: DC
  Filled 2023-01-24: qty 10, 5d supply, fill #0

## 2023-01-24 NOTE — ED Triage Notes (Addendum)
Sore throat x 2 days ago , urinary frequency and dysuria x 1 week .  Also co rectal pain , constipation , last BM 5 days ago

## 2023-01-24 NOTE — ED Provider Notes (Signed)
[place] [facility] Provider Note   CSN: [id number] Arrival date & time: 01/24/23  1111     History  Chief Complaint  Patient presents with   Sore Throat   Urinary Frequency    Chelsea Newton is a 27 y.o. female.  Patient with history of morbid obesity presents today with complaints of sore throat, dysuria, and constipation.  She states that her throat has been sore for 1 week and she has had dysuria since last Monday. Also states that she has not had a bowel movement in 5 days which is unusual for her. She has not tried anything for her symptoms. She denies fevers, chills, nausea, vomiting, or abdominal pain. No history of abdominal surgeries. She does state that she was having vaginal discharge and on 3/20 she had STD screening that showed bacterial vaginosis. She has completed treatment for BV with resolution of these symptoms. She was negative for gonorrhea and chlamydia. She is able to swallow without difficulty.   The history is provided by the patient. No language interpreter was used.  Sore Throat  Urinary Frequency       Home Medications Prior to Admission medications   Not on File      Allergies    Patient has no known allergies.    Review of Systems   Review of Systems  HENT:  Positive for sore throat.   Gastrointestinal:  Positive for constipation.  Genitourinary:  Positive for dysuria.  All other systems reviewed and are negative.   Physical Exam Updated Vital Signs BP (!) 142/100 (BP Location: Left Arm)   Pulse (!) 107   Temp (!) 97.5 F (36.4 C) (Oral)   Resp 20   Wt (!) 142.9 kg   LMP 01/03/2023 (Exact Date)   SpO2 100%   BMI 43.93 kg/m  Physical Exam Vitals and nursing note reviewed.  Constitutional:      General: She is not in acute distress.    Appearance: Normal appearance. She is obese. She is not ill-appearing, toxic-appearing or diaphoretic.  HENT:     Head: Normocephalic and atraumatic.      Right Ear: Tympanic membrane and ear canal normal.     Left Ear: Tympanic membrane and ear canal normal.     Mouth/Throat:     Mouth: Mucous membranes are moist.     Pharynx: Uvula midline.     Tonsils: No tonsillar exudate or tonsillar abscesses. 2+ on the right. 2+ on the left.  Eyes:     Conjunctiva/sclera: Conjunctivae normal.     Pupils: Pupils are equal, round, and reactive to light.  Neck:     Comments: No meningismus Cardiovascular:     Rate and Rhythm: Normal rate and regular rhythm.     Heart sounds: Normal heart sounds.  Pulmonary:     Effort: Pulmonary effort is normal. No respiratory distress.     Breath sounds: Normal breath sounds.  Abdominal:     General: Abdomen is flat. Bowel sounds are normal.     Palpations: Abdomen is soft.     Tenderness: There is no abdominal tenderness.  Musculoskeletal:        General: Normal range of motion.     Cervical back: Normal range of motion.  Skin:    General: Skin is warm and dry.  Neurological:     General: No focal deficit present.     Mental Status: She is alert and oriented to person, place, and time.  Psychiatric:        Mood and Affect: Mood normal.        Behavior: Behavior normal.     ED Results / Procedures / Treatments   Labs (all labs ordered are listed, but only abnormal results are displayed) Labs Reviewed  URINALYSIS, ROUTINE W REFLEX MICROSCOPIC - Abnormal; Notable for the following components:      Result Value   Glucose, UA 100 (*)    Hgb urine dipstick LARGE (*)    Protein, ur 100 (*)    Nitrite POSITIVE (*)    Leukocytes,Ua TRACE (*)    All other components within normal limits  URINALYSIS, MICROSCOPIC (REFLEX) - Abnormal; Notable for the following components:   Bacteria, UA MANY (*)    All other components within normal limits  GROUP A STREP BY PCR  RESP PANEL BY RT-PCR (RSV, FLU A&B, COVID)  [id number]  PREGNANCY, URINE    EKG None  Radiology No results found.  Procedures Procedures     Medications Ordered in ED Medications  dexamethasone (DECADRON) tablet 4 mg (4 mg Oral Given 01/24/23 1338)    ED Course/ Medical Decision Making/ A&P                             Medical Decision Making Amount and/or Complexity of Data Reviewed Labs: ordered.  Risk Prescription drug management.   This patient is a 27 y.o. female who presents to the ED for concern of dysuria, sore throat, constipation, this involves an extensive number of treatment options, and is a complaint that carries with it a high risk of complications and morbidity. The emergent differential diagnosis prior to evaluation includes, but is not limited to,  STI, UTI, strep, URI, bowel obstruction, constipation. This is not an exhaustive differential.   Past Medical History / Co-morbidities / Social History: Hx morbid obesity  Additional history: Chart reviewed. Pertinent results include: negative GC/Chlamydia testing on 3/20  Physical Exam: Physical exam performed. The pertinent findings include: tonsillar erythema and swelling without exudate. No signs of PTA or RPA. Abdomen soft and nontender  Lab Tests: I ordered, and personally interpreted labs.  The pertinent results include:  UA infectious, strep negative   Medications: I ordered medication including decadron  for tonsilitis. Reevaluation of the patient after these medicines showed that the patient improved. I have reviewed the patients home medicines and have made adjustments as needed.   Disposition:  Patient presents today with complaints of dysuria, sore throat, and constipation.  She is afebrile, nontoxic-appearing, in no acute distress with reassuring vital signs.  UA is consistent with a UTI, will treat for same with Macrobid.  Patient given Decadron likely for tonsillitis.  No signs of PTA or RPA.  Her abdomen is also soft and nontender and therefore very low suspicion for bowel obstruction or perforation.  Will recommend MiraLAX bowel  regimen for management of constipation.  Instructions given for same. Evaluation and diagnostic testing in the emergency department does not suggest an emergent condition requiring admission or immediate intervention beyond what has been performed at this time.  Plan for discharge with close PCP follow-up.  Patient is understanding and amenable with plan, educated on red flag symptoms that would prompt immediate return.  Patient discharged in stable condition.   Final Clinical Impression(s) / ED Diagnoses Final diagnoses:  Acute cystitis with hematuria  Tonsillitis  Constipation, unspecified constipation type    Rx / DC Orders ED  Discharge Orders          Ordered    nitrofurantoin, macrocrystal-monohydrate, (MACROBID) 100 MG capsule  2 times daily        01/24/23 1416          An After Visit Summary was printed and given to the patient.     Nestor Lewandowsky 01/24/23 1418    Elgie Congo, MD 01/24/23 2040

## 2023-01-24 NOTE — Discharge Instructions (Addendum)
As we discussed, your urine did show signs of infection.  I have given you a prescription for Macrobid which is an antibiotic for you to take as prescribed in its entirety for management of this.  For your constipation, you may do a Miralax bowel cleanout for relief which includes 8 capfulls of Miralax in Gatorade. Drink this in 2 hours on a day where you dont have plans and expect significant bowel movements throughout the day ending in liquid stools.  You may also add an Ex-Lax chocolate and/or a fleets enema as needed for additional assistance with bowel movements.  You may get all of these medications over-the-counter at your local pharmacy when you go to pick up your antibiotic.  You may continue forward with 1 capfull of Miralax twice a day if difficulties with bowel movements continue.   Follow-up with your primary care doctor for any additional health needs.  Return if development of any new or worsening symptoms.

## 2023-03-05 ENCOUNTER — Other Ambulatory Visit: Payer: Self-pay

## 2023-03-05 ENCOUNTER — Emergency Department (HOSPITAL_BASED_OUTPATIENT_CLINIC_OR_DEPARTMENT_OTHER)
Admission: EM | Admit: 2023-03-05 | Discharge: 2023-03-05 | Disposition: A | Discharge status: Home / Self Care | Payer: Medicaid Other | Attending: Emergency Medicine | Admitting: Emergency Medicine

## 2023-03-05 ENCOUNTER — Encounter (HOSPITAL_BASED_OUTPATIENT_CLINIC_OR_DEPARTMENT_OTHER): Payer: Self-pay | Admitting: Emergency Medicine

## 2023-03-05 DIAGNOSIS — J020 Streptococcal pharyngitis: Secondary | ICD-10-CM | POA: Diagnosis not present

## 2023-03-05 DIAGNOSIS — H66003 Acute suppurative otitis media without spontaneous rupture of ear drum, bilateral: Secondary | ICD-10-CM

## 2023-03-05 DIAGNOSIS — Z1152 Encounter for screening for COVID-19: Secondary | ICD-10-CM | POA: Insufficient documentation

## 2023-03-05 DIAGNOSIS — J029 Acute pharyngitis, unspecified: Secondary | ICD-10-CM | POA: Diagnosis present

## 2023-03-05 LAB — GROUP A STREP BY PCR: Group A Strep by PCR: DETECTED — AB

## 2023-03-05 LAB — SARS CORONAVIRUS 2 BY RT PCR: SARS Coronavirus 2 by RT PCR: NEGATIVE

## 2023-03-05 MED ORDER — AMOXICILLIN-POT CLAVULANATE 400-57 MG/5ML PO SUSR: 875.0000 mg | Freq: Two times a day (BID) | ORAL | 0 refills | Status: AC

## 2023-03-05 MED ORDER — IBUPROFEN 100 MG/5ML PO SUSP: 600.0000 mg | Freq: Four times a day (QID) | ORAL | 0 refills | Status: AC | PRN

## 2023-03-05 NOTE — ED Provider Notes (Signed)
[place] [facility]ER[facility]ENC[facility] AT [facility] Provider Note   CSN: [id number] Arrival date & time: 03/05/23  1016     History  Chief Complaint  Patient presents with   Cough   Sore Throat    Chelsea Newton is a 27 y.o. female.  Patient presents to the emergency department for evaluation of 1 week of upper respiratory symptoms.  She began having a sore throat about a week ago.  This then progressed to bilateral ear pain, nasal congestion, cough, body aches.  No documented fever however temperature is 100.4 F on arrival here.  She is been taking over-the-counter medication such as NyQuil without improvement.  No known sick contacts.  No vomiting.  She states that it is very difficult to swallow due to pain in her throat.  She is drinking some fluids.       Home Medications Prior to Admission medications   Medication Sig Start Date End Date Taking? Authorizing Provider  amoxicillin-clavulanate (AUGMENTIN) 400-57 MG/5ML suspension Take 10.9 mLs (872 mg total) by mouth 2 (two) times daily for 10 days. 03/05/23 03/15/23 Yes Renne Crigler, PA-C  ibuprofen (ADVIL) 100 MG/5ML suspension Take 30 mLs (600 mg total) by mouth every 6 (six) hours as needed for fever, mild pain or moderate pain. 03/05/23  Yes Renne Crigler, PA-C      Allergies    Patient has no known allergies.    Review of Systems   Review of Systems  Physical Exam Updated Vital Signs BP (!) 152/87   Pulse (!) 121   Temp (!) 100.4 F (38 C) (Oral)   Resp 18   Ht 5\' 11"  (1.803 m)   Wt (!) 142.9 kg   LMP 02/23/2023   SpO2 99%   BMI 43.93 kg/m  Physical Exam Vitals and nursing note reviewed.  Constitutional:      Appearance: She is well-developed.  HENT:     Head: Normocephalic and atraumatic.     Jaw: No trismus.     Right Ear: Ear canal and external ear normal. Tympanic membrane is injected, erythematous and bulging.     Left Ear: Ear canal and external ear normal. Tympanic membrane is injected,  erythematous and bulging.     Nose: Congestion present. No mucosal edema or rhinorrhea.     Mouth/Throat:     Mouth: Mucous membranes are moist. Mucous membranes are not dry. No oral lesions.     Pharynx: Uvula midline. Oropharyngeal exudate and posterior oropharyngeal erythema present. No uvula swelling.     Tonsils: Tonsillar exudate present. No tonsillar abscesses. 4+ on the right. 4+ on the left.  Eyes:     General:        Right eye: No discharge.        Left eye: No discharge.     Conjunctiva/sclera: Conjunctivae normal.  Cardiovascular:     Rate and Rhythm: Normal rate and regular rhythm.     Heart sounds: Normal heart sounds.  Pulmonary:     Effort: Pulmonary effort is normal. No respiratory distress.     Breath sounds: Normal breath sounds. No wheezing or rales.  Abdominal:     Palpations: Abdomen is soft.     Tenderness: There is no abdominal tenderness.  Musculoskeletal:     Cervical back: Normal range of motion and neck supple.  Lymphadenopathy:     Cervical: No cervical adenopathy.  Skin:    General: Skin is warm and dry.  Neurological:     Mental Status:  She is alert.  Psychiatric:        Mood and Affect: Mood normal.     ED Results / Procedures / Treatments   Labs (all labs ordered are listed, but only abnormal results are displayed) Labs Reviewed  GROUP A STREP BY PCR - Abnormal; Notable for the following components:      Result Value   Group A Strep by PCR DETECTED (*)    All other components within normal limits  SARS CORONAVIRUS 2 BY RT PCR    EKG None  Radiology No results found.  Procedures Procedures    Medications Ordered in ED Medications - No data to display  ED Course/ Medical Decision Making/ A&P    Patient seen and examined. History obtained directly from patient. Work-up including labs, imaging, EKG ordered in triage, if performed, were reviewed.    Labs/EKG: Independently reviewed and interpreted.  This included: Strep test  positive, COVID-negative.  Imaging: None ordered  Medications/Fluids: Offered IM Decadron, she declined stating that she does not want a shot.  She also does not want to swallow pills at this time.  Will prescribe liquid antibiotic and ibuprofen for patient.  Most recent vital signs reviewed and are as follows: BP (!) 152/87   Pulse (!) 121   Temp (!) 100.4 F (38 C) (Oral)   Resp 18   Ht 5\' 11"  (1.803 m)   Wt (!) 142.9 kg   LMP 02/23/2023   SpO2 99%   BMI 43.93 kg/m   Initial impression: Strep pharyngitis, bilateral otitis media  Home treatment plan: Rest, hydration  Return instructions discussed with patient: Inability to swallow, uncontrolled pain, persistent high fever.  Follow-up instructions discussed with patient: Follow-up with PCP in 3 to 5 days if not improving.                            Medical Decision Making Risk Prescription drug management.   Patient with initial sore throat and now more substantial URI symptoms.  She is tested positive for strep.  Her pharyngeal exam is consistent with strep throat, no signs of peritonsillar abscess at this time.  Normal voice.  Full range motion of neck.  Her ears also exhibit signs of infection with bulging TMs, erythema.   In regards to the patient's sore throat today, the following dangerous and potentially life threatening etiologies were considered on the differential diagnosis: Lugwig's angina, uvulitis, epiglottis, peritonsillar abscess, retropharyngeal abscess, Lemierre's syndrome. Also considered were more common causes such as: streptococcal pharyngitis, gonococcal pharyngitis, non-bacterial pharyngitis (cold viruses, HSV/coxsackievirus, influenza, COVID-19, infectious mononucleosis, oropharyngeal candidiasis), and other non-infectious causes including seasonal allergies/post-nasal drip, GERD/esophagitis, trauma.   The patient's vital signs, pertinent lab work and imaging were reviewed and interpreted as discussed in  the ED course. Hospitalization was considered for further testing, treatments, or serial exams/observation. However as patient is well-appearing, has a stable exam, and reassuring studies today, I do not feel that they warrant admission at this time. This plan was discussed with the patient who verbalizes agreement and comfort with this plan and seems reliable and able to return to the Emergency Department with worsening or changing symptoms.          Final Clinical Impression(s) / ED Diagnoses Final diagnoses:  Strep pharyngitis  Non-recurrent acute suppurative otitis media of both ears without spontaneous rupture of tympanic membranes    Rx / DC Orders ED Discharge Orders  Ordered    amoxicillin-clavulanate (AUGMENTIN) 400-57 MG/5ML suspension  2 times daily        03/05/23 1216    ibuprofen (ADVIL) 100 MG/5ML suspension  Every 6 hours PRN        03/05/23 1216              Renne Crigler, PA-C 03/05/23 1221    Lonell Grandchild, MD 03/06/23 [phone]

## 2023-03-05 NOTE — ED Notes (Signed)
RN offered tylenol to patient however patient states she cannot swallow any pills. EDP made aware.

## 2023-03-05 NOTE — ED Triage Notes (Signed)
Patient arrives ambulatory by POV c/o sore throat, body aches, bilateral ear aches and cough since last week. Has been taking nyquil w/o relief.

## 2023-03-05 NOTE — Discharge Instructions (Signed)
Please read and follow all provided instructions.  Your diagnoses today include:  1. Strep pharyngitis   2. Non-recurrent acute suppurative otitis media of both ears without spontaneous rupture of tympanic membranes     Tests performed today include: Strep test: was POSITIVE for strep throat COVID test: Was negative for COVID Vital signs. See below for your results today.   Medications prescribed:  Augmentin - antibiotic  You have been prescribed an antibiotic medicine: take the entire course of medicine even if you are feeling better. Stopping early can cause the antibiotic not to work.  Ibuprofen (Motrin, Advil) - anti-inflammatory pain medication Do not exceed 600mg  ibuprofen every 6 hours, take with food  You have been prescribed an anti-inflammatory medication or NSAID. Take with food. Take smallest effective dose for the shortest duration needed for your pain. Stop taking if you experience stomach pain or vomiting.   Take any medications prescribed only as directed.   Home care instructions:  Please read the educational materials provided and follow any instructions contained in this packet.  Follow-up instructions: Please follow-up with your primary care provider as needed for further evaluation of your symptoms.  Return instructions:  Please return to the Emergency Department if you experience worsening symptoms.  Return if you have worsening problems swallowing, your neck becomes swollen, you cannot swallow your saliva or your voice becomes muffled.  Return with high persistent fever, persistent vomiting, or if you have trouble breathing.  Please return if you have any other emergent concerns.  Additional Information:  Your vital signs today were: BP (!) 152/87   Pulse (!) 121   Temp (!) 100.4 F (38 C) (Oral)   Resp 18   Ht 5\' 11"  (1.803 m)   Wt (!) 142.9 kg   LMP 02/23/2023   SpO2 99%   BMI 43.93 kg/m  If your blood pressure (BP) was elevated above 135/85  this visit, please have this repeated by your doctor within one month. --------------

## 2023-09-30 ENCOUNTER — Encounter (HOSPITAL_BASED_OUTPATIENT_CLINIC_OR_DEPARTMENT_OTHER): Payer: Self-pay | Admitting: Emergency Medicine

## 2023-09-30 ENCOUNTER — Emergency Department (HOSPITAL_BASED_OUTPATIENT_CLINIC_OR_DEPARTMENT_OTHER)
Admission: EM | Admit: 2023-09-30 | Discharge: 2023-09-30 | Disposition: A | Discharge status: Home / Self Care | Payer: Medicaid Other | Attending: Emergency Medicine | Admitting: Emergency Medicine

## 2023-09-30 ENCOUNTER — Emergency Department (HOSPITAL_BASED_OUTPATIENT_CLINIC_OR_DEPARTMENT_OTHER): Payer: Medicaid Other

## 2023-09-30 ENCOUNTER — Other Ambulatory Visit: Payer: Self-pay

## 2023-09-30 DIAGNOSIS — M79605 Pain in left leg: Secondary | ICD-10-CM | POA: Diagnosis present

## 2023-09-30 DIAGNOSIS — M25562 Pain in left knee: Secondary | ICD-10-CM | POA: Insufficient documentation

## 2023-09-30 MED ORDER — NAPROXEN 375 MG PO TABS: 375.0000 mg | ORAL_TABLET | Freq: Two times a day (BID) | ORAL | 0 refills | Status: AC

## 2023-09-30 MED ORDER — ACETAMINOPHEN 325 MG PO TABS
650.0000 mg | ORAL_TABLET | Freq: Once | ORAL | Status: AC
  Administered 2023-09-30: 650 mg via ORAL
  Filled 2023-09-30: qty 2

## 2023-09-30 NOTE — Discharge Instructions (Addendum)
Thank you for letting us evaluate you today. Your XR was negative for fracture or dislocation but has a small collection of fluid which can be due to a ligamentous injury, overuse (which is not visualized on XRs). Please follow up with ortho regarding further management. In meantime, make sure to rest, ice, elevate, and wear knee brace.   Please return to ED if you experience red, hot, swollen leg or knee indicating infection, pale, loss of sensation of leg to indicate poor circulation or clot.

## 2023-09-30 NOTE — ED Notes (Signed)
Discharge instructions reviewed with patient. Patient verbalizes understanding, no further questions at this time. Medications/prescriptions and follow up information provided. No acute distress noted at time of departure.  

## 2023-09-30 NOTE — ED Triage Notes (Signed)
Left sharp leg pain shooting from left knee to lower leg , no injury or fall .  Reports no swelling .

## 2023-09-30 NOTE — ED Provider Notes (Signed)
[place] [facility] AT [facility] Provider Note   CSN: [id number] Arrival date & time: 09/30/23  1137     History  Chief Complaint  Patient presents with   Leg Pain    Chelsea Newton is a 27 y.o. female with no known past medical history presents emergency department for evaluation of left leg pain.  She reports that 2 days ago she noticed that her left leg started hurting causing difficulty with ambulating and standing for long periods of time at her job at [employer]. She does not recall recent injury or precipitating event. She reports that most of the pain starts on the lateral aspect of her left knee but she also has pain in her lower calf and thigh.  She denies previous clots, hemoptysis, recent travel, shortness of breath, chest pain, fevers, complaints prior to leg pain.   Leg Pain Associated symptoms: no fatigue and no fever      Home Medications Prior to Admission medications   Medication Sig Start Date End Date Taking? Authorizing Provider  naproxen (NAPROSYN) 375 MG tablet Take 1 tablet (375 mg total) by mouth 2 (two) times daily for 25 days. 09/30/23 10/25/23 Yes Judithann Sheen, PA  ibuprofen (ADVIL) 100 MG/5ML suspension Take 30 mLs (600 mg total) by mouth every 6 (six) hours as needed for fever, mild pain or moderate pain. 03/05/23   Renne Crigler, PA-C      Allergies    Patient has no known allergies.    Review of Systems   Review of Systems  Constitutional:  Negative for chills, fatigue and fever.  Respiratory:  Negative for cough, chest tightness, shortness of breath and wheezing.   Cardiovascular:  Negative for chest pain and palpitations.  Gastrointestinal:  Negative for abdominal pain, constipation, diarrhea, nausea and vomiting.  Musculoskeletal:  Positive for gait problem and myalgias.  Neurological:  Negative for dizziness, seizures, weakness, light-headedness, numbness and headaches.    Physical Exam Updated Vital Signs BP (!)  150/80   Pulse 79   Temp 97.6 F (36.4 C) (Oral)   Resp 20   Wt (!) 147.4 kg   LMP 09/14/2023   SpO2 100%   BMI 45.33 kg/m  Physical Exam Vitals and nursing note reviewed.  Constitutional:      General: She is not in acute distress.    Appearance: Normal appearance.  HENT:     Head: Normocephalic and atraumatic.  Eyes:     Conjunctiva/sclera: Conjunctivae normal.  Cardiovascular:     Rate and Rhythm: Normal rate.     Comments: Dorsalis pedis 2+ bilaterally Pulmonary:     Effort: Pulmonary effort is normal. No respiratory distress.     Breath sounds: Normal breath sounds.  Musculoskeletal:        General: No swelling or deformity.     Right lower leg: No edema.     Left lower leg: No edema.     Comments: Able to flex and extend knee but has limited ROM with knee extension 2/2 pain TTP of lateral, medial, and popliteal fossa of L knee Calf pain with dorsiflexion of L ankle but no calf TTP No appreciable swelling to LLE  Skin:    General: Skin is warm.     Capillary Refill: Capillary refill takes less than 2 seconds.     Coloration: Skin is not jaundiced or pale.     Findings: No erythema.  Neurological:     Mental Status: She is alert and oriented to  person, place, and time. Mental status is at baseline.     Sensory: No sensory deficit.     Gait: Gait abnormal (Limp and increased weight on RLE d/t pain).    ED Results / Procedures / Treatments   Labs (all labs ordered are listed, but only abnormal results are displayed) Labs Reviewed - No data to display  EKG None  Radiology DG Knee Complete 4 Views Left  Result Date: 09/30/2023 CLINICAL DATA:  Pain x2 days EXAM: LEFT KNEE - COMPLETE 4+ VIEW COMPARISON:  None Available. FINDINGS: No evidence of fracture, dislocation. Small effusion in the suprapatellar bursa. No evidence of arthropathy or other focal bone abnormality. Soft tissues are unremarkable. IMPRESSION: Small effusion. No fracture or other acute findings.  Electronically Signed   By: Corlis Leak M.D.   On: 09/30/2023 12:33    Procedures Procedures    Medications Ordered in ED Medications  acetaminophen (TYLENOL) tablet 650 mg (650 mg Oral Given 09/30/23 1209)    ED Course/ Medical Decision Making/ A&P                                 Medical Decision Making  Patient presents to the ED for concern of LLE pain, this involves an extensive number of treatment options, and is a complaint that carries with it a high risk of complications and morbidity.  The differential diagnosis includes fracture, ligament injury, muscle strain, DVT   Co morbidities that complicate the patient evaluation  BMI 45   Additional history obtained:  Additional history obtained from Nursing and Outside Medical Records   External records from outside source obtained and reviewed including triage RN note   Imaging Studies ordered:  I ordered imaging studies including L knee XR and DVT study  I independently visualized and interpreted imaging which showed small joint effusion but no fracture or dislocation I agree with the radiologist interpretation    Medicines ordered and prescription drug management:  I ordered medication including tylenol  for LLE pain  Reevaluation of the patient after these medicines showed that the patient improved I have reviewed the patients home medicines and have made adjustments as needed    Problem List / ED Course:  L knee pain PE significant for limited and pain with knee extension.  There is no appreciable swelling nor warmth to indicate septic joint or infection X-rays negative for fracture or dislocation.  However, there is a small effusion.  This may be due to ligamentous injury, trauma, overuse Provided Tylenol in ED for pain as requested by patient - reports improvement of pain following Tylenol Will provide prescription of naproxen and provide Ortho follow-up  Reevaluation:  After the interventions noted  above, I reevaluated the patient and found that they have :improved   Social Determinants of Health:  Has PCP and ortho f/u   Dispostion:  After consideration of the diagnostic results and the patients response to treatment, I feel that the patent would benefit from orthopedic follow-up.  Discussed findings, disposition, return to emergency department precautions with patient expresses understanding agrees with plan. Discussed RICE and symptomatic management. Provided work note to limit WB and prolonged standing on LLE. All questions answered to her satisfaction.  Discussed patient presentation, PE, imaging, disposition with Dr. Jacqulyn Bath who agrees with plan.       Final Clinical Impression(s) / ED Diagnoses Final diagnoses:  Acute pain of left knee    Rx /  DC Orders ED Discharge Orders          Ordered    naproxen (NAPROSYN) 375 MG tablet  2 times daily        09/30/23 1250             Judithann Sheen, [place] 09/30/23 1256    Maia Plan, MD 09/30/23 1500

## 2023-12-31 ENCOUNTER — Emergency Department (HOSPITAL_BASED_OUTPATIENT_CLINIC_OR_DEPARTMENT_OTHER)

## 2023-12-31 ENCOUNTER — Emergency Department (HOSPITAL_BASED_OUTPATIENT_CLINIC_OR_DEPARTMENT_OTHER)
Admission: EM | Admit: 2023-12-31 | Discharge: 2023-12-31 | Disposition: A | Discharge status: Home / Self Care | Attending: Emergency Medicine | Admitting: Emergency Medicine

## 2023-12-31 ENCOUNTER — Other Ambulatory Visit: Payer: Self-pay

## 2023-12-31 ENCOUNTER — Encounter (HOSPITAL_BASED_OUTPATIENT_CLINIC_OR_DEPARTMENT_OTHER): Payer: Self-pay | Admitting: Emergency Medicine

## 2023-12-31 DIAGNOSIS — G5621 Lesion of ulnar nerve, right upper limb: Secondary | ICD-10-CM

## 2023-12-31 DIAGNOSIS — M79641 Pain in right hand: Secondary | ICD-10-CM | POA: Diagnosis present

## 2023-12-31 MED ORDER — NAPROXEN 375 MG PO TABS: 375.0000 mg | ORAL_TABLET | Freq: Two times a day (BID) | ORAL | 0 refills | Status: AC

## 2023-12-31 NOTE — ED Triage Notes (Signed)
 C/o r hand pain x 1 week. States it is keeping her from working. Denies injury to area.

## 2023-12-31 NOTE — Discharge Instructions (Signed)
 Follow-up with the orthopedic hand specialist.  Get help right away if you develop clawhand or inability to move the fingers, pale white fingers or blue fingers.

## 2023-12-31 NOTE — ED Provider Notes (Signed)
 [place] [facility] Provider Note   CSN: [id number] Arrival date & time: 12/31/23  1913     History  Chief Complaint  Patient presents with   Hand Pain    Chelsea Newton is a 28 y.o. female who presents with a cc of r hand pain in the 4th and 5th digit. Radiating up the arm. Described as sharp and burning. R hand dominant.   Hand Pain       Home Medications Prior to Admission medications   Medication Sig Start Date End Date Taking? Authorizing Provider  ibuprofen (ADVIL) 100 MG/5ML suspension Take 30 mLs (600 mg total) by mouth every 6 (six) hours as needed for fever, mild pain or moderate pain. 03/05/23   Renne Crigler, PA-C      Allergies    Patient has no known allergies.    Review of Systems   Review of Systems  Physical Exam Updated Vital Signs BP (!) 152/85 (BP Location: Right Arm)   Pulse 82   Temp 98.7 F (37.1 C) (Oral)   Resp 18   Ht 5\' 9"  (1.753 m)   Wt (!) 145.2 kg   SpO2 100%   BMI 47.26 kg/m  Physical Exam Vitals and nursing note reviewed.  Constitutional:      General: She is not in acute distress.    Appearance: She is well-developed. She is not diaphoretic.  HENT:     Head: Normocephalic and atraumatic.     Right Ear: External ear normal.     Left Ear: External ear normal.     Nose: Nose normal.     Mouth/Throat:     Mouth: Mucous membranes are moist.     Pharynx: No oropharyngeal exudate.  Eyes:     General: No scleral icterus.    Extraocular Movements: Extraocular movements intact.     Conjunctiva/sclera: Conjunctivae normal.     Pupils: Pupils are equal, round, and reactive to light.  Neck:     Thyroid: No thyromegaly.     Vascular: No JVD.     Comments: No meningismus Cardiovascular:     Rate and Rhythm: Normal rate and regular rhythm.     Heart sounds: Normal heart sounds. No murmur heard.    No friction rub. No gallop.  Pulmonary:     Effort: Pulmonary effort is normal. No  respiratory distress.     Breath sounds: Normal breath sounds.  Abdominal:     General: Bowel sounds are normal. There is no distension.     Palpations: Abdomen is soft. There is no mass.     Tenderness: There is no abdominal tenderness. There is no guarding.  Musculoskeletal:        General: No tenderness. Normal range of motion.     Cervical back: Normal range of motion and neck supple.     Comments: RUE exam Inspection- No erythema, swelling, atrophy, hypertrophy, abrasions, or lacerations noted. Palpation- No ttp forearm or lateral condyle.compartments soft ROM- Full ROM about the shoulder, elbow, wrist. Strength- 5/5 AIN/PIN/U NV- SILT M/R/U, +2 Radial +2 ulnar pulse  Skin:    General: Skin is warm and dry.     Findings: No rash.  Neurological:     Mental Status: She is alert and oriented to person, place, and time.     Cranial Nerves: No cranial nerve deficit.     Coordination: Coordination normal.     Deep Tendon Reflexes: Reflexes are normal and symmetric.  Comments: Speech is clear and goal oriented, follows commands Major Cranial nerves without deficit, no facial droop Normal strength in upper and lower extremities bilaterally including dorsiflexion and plantar flexion, strong and equal grip strength Sensation normal to light and sharp touch Moves extremities without ataxia, coordination intact Normal finger to nose and rapid alternating movements Neg romberg, no pronator drift Normal gait Normal heel-shin and balance   Psychiatric:        Behavior: Behavior normal.     ED Results / Procedures / Treatments   Labs (all labs ordered are listed, but only abnormal results are displayed) Labs Reviewed - No data to display  EKG None  Radiology No results found.  Procedures Procedures    Medications Ordered in ED Medications - No data to display  ED Course/ Medical Decision Making/ A&P                                 Medical Decision Making Amount  and/or Complexity of Data Reviewed Radiology: ordered.   Patient here with hand pain in the distribution of the ulnar nerve.  Neurovascularly intact.  No signs or symptoms of trigger finger, carpal tunnel syndrome, tenosynovitis or infection.  Suspect ulnar neuropathy versus cubital tunnel syndrome.  Patient placed in ulnar gutter splint.  Anti-inflammatories and will up with hand specialist.  Appropriate for discharge at this time        Final Clinical Impression(s) / ED Diagnoses Final diagnoses:  None    Rx / DC Orders ED Discharge Orders     None         Arthor Captain, PA-C 12/31/23 2031    Virgina Norfolk, DO 12/31/23 2228

## 2024-02-26 ENCOUNTER — Ambulatory Visit
Admission: RE | Admit: 2024-02-26 | Discharge: 2024-02-26 | Disposition: A | Discharge status: Home / Self Care | Payer: Self-pay | Source: Ambulatory Visit

## 2024-02-26 ENCOUNTER — Other Ambulatory Visit: Payer: Self-pay

## 2024-02-26 DIAGNOSIS — T148XXA Other injury of unspecified body region, initial encounter: Secondary | ICD-10-CM

## 2024-05-17 ENCOUNTER — Emergency Department (HOSPITAL_BASED_OUTPATIENT_CLINIC_OR_DEPARTMENT_OTHER)
Admission: EM | Admit: 2024-05-17 | Discharge: 2024-05-17 | Disposition: A | Discharge status: Home / Self Care | Attending: Emergency Medicine | Admitting: Emergency Medicine

## 2024-05-17 ENCOUNTER — Encounter (HOSPITAL_BASED_OUTPATIENT_CLINIC_OR_DEPARTMENT_OTHER): Payer: Self-pay | Admitting: Urology

## 2024-05-17 ENCOUNTER — Other Ambulatory Visit: Payer: Self-pay

## 2024-05-17 ENCOUNTER — Emergency Department (HOSPITAL_BASED_OUTPATIENT_CLINIC_OR_DEPARTMENT_OTHER)

## 2024-05-17 DIAGNOSIS — M778 Other enthesopathies, not elsewhere classified: Secondary | ICD-10-CM | POA: Insufficient documentation

## 2024-05-17 DIAGNOSIS — M79642 Pain in left hand: Secondary | ICD-10-CM | POA: Diagnosis present

## 2024-05-17 DIAGNOSIS — D649 Anemia, unspecified: Secondary | ICD-10-CM | POA: Diagnosis not present

## 2024-05-17 DIAGNOSIS — M779 Enthesopathy, unspecified: Secondary | ICD-10-CM

## 2024-05-17 LAB — BASIC METABOLIC PANEL WITH GFR
Anion gap: 11 (ref 5–15)
BUN: 13 mg/dL (ref 6–20)
CO2: 27 mmol/L (ref 22–32)
Calcium: 9.3 mg/dL (ref 8.9–10.3)
Chloride: 104 mmol/L (ref 98–111)
Creatinine, Ser: 0.96 mg/dL (ref 0.44–1.00)
GFR, Estimated: >60 mL/min (ref >60)
Glucose, Bld: 101 mg/dL — ABNORMAL HIGH (ref 70–99)
Potassium: 3.9 mmol/L (ref 3.5–5.1)
Sodium: 142 mmol/L (ref 135–145)

## 2024-05-17 LAB — PREGNANCY, URINE: Preg Test, Ur: NEGATIVE

## 2024-05-17 LAB — CBC WITH DIFFERENTIAL/PLATELET
Abs Immature Granulocytes: 0.02 K/uL (ref 0.00–0.07)
Basophils Absolute: 0 K/uL (ref 0.0–0.1)
Basophils Relative: 0 %
Eosinophils Absolute: 0.1 K/uL (ref 0.0–0.5)
Eosinophils Relative: 1 %
HCT: 36.5 % (ref 36.0–46.0)
Hemoglobin: 11.9 g/dL — ABNORMAL LOW (ref 12.0–15.0)
Immature Granulocytes: 0 %
Lymphocytes Relative: 38 %
Lymphs Abs: 2.7 K/uL (ref 0.7–4.0)
MCH: 27.6 pg (ref 26.0–34.0)
MCHC: 32.6 g/dL (ref 30.0–36.0)
MCV: 84.7 fL (ref 80.0–100.0)
Monocytes Absolute: 0.5 K/uL (ref 0.1–1.0)
Monocytes Relative: 7 %
Neutro Abs: 3.8 K/uL (ref 1.7–7.7)
Neutrophils Relative %: 54 %
Platelets: 287 K/uL (ref 150–400)
RBC: 4.31 MIL/uL (ref 3.87–5.11)
RDW: 14.7 % (ref 11.5–15.5)
WBC: 7.1 K/uL (ref 4.0–10.5)
nRBC: 0 % (ref 0.0–0.2)

## 2024-05-17 LAB — C-REACTIVE PROTEIN: CRP: 1.3 mg/dL — ABNORMAL HIGH (ref <1.0)

## 2024-05-17 LAB — SEDIMENTATION RATE: Sed Rate: 45 mm/h — ABNORMAL HIGH (ref 0–22)

## 2024-05-17 MED ORDER — IBUPROFEN 800 MG PO TABS
800.0000 mg | ORAL_TABLET | Freq: Once | ORAL | Status: AC
  Administered 2024-05-17: 800 mg via ORAL
  Filled 2024-05-17: qty 1

## 2024-05-17 NOTE — ED Provider Notes (Signed)
  EMERGENCY DEPARTMENT AT [facility] Provider Note   CSN: [id number] Arrival date & time: 05/17/24  2006     Patient presents with: Hand Pain   Chelsea Newton is a 28 y.o. female.    Hand Pain     28 year old female presenting to the emergency department with pain that has been present in her fingers.  The patient is right-handed.  She works at [employer].  She states that she has had no trauma to her fingers.  She developed pain in her distal left ring finger that radiated to her wrist, now endorsing pain in her left pinky finger as well.  No fevers, chills, redness or swelling.  She denies any elbow pain.  Pain does not radiate from her elbow down to her hand.  Pain has been ongoing for the past 2 weeks.  Worse with any attempts at range of motion.  No significant joint swelling.  Prior to Admission medications   Medication Sig Start Date End Date Taking? Authorizing Provider  ibuprofen  (ADVIL ) 100 MG/5ML suspension Take 30 mLs (600 mg total) by mouth every 6 (six) hours as needed for fever, mild pain or moderate pain. 03/05/23   Geiple, Joshua, PA-C  naproxen  (NAPROSYN ) 375 MG tablet Take 1 tablet (375 mg total) by mouth 2 (two) times daily with a meal. 12/31/23   Arloa Chroman, PA-C    Allergies: Patient has no known allergies.    Review of Systems  Musculoskeletal:  Positive for arthralgias.  All other systems reviewed and are negative.   Updated Vital Signs BP (!) 157/93 (BP Location: Right Arm)   Pulse 96   Temp (!) 97.4 F (36.3 C)   Resp 18   Ht 5' 9 (1.753 m)   Wt (!) 145.2 kg   LMP 05/07/2024 (Exact Date)   SpO2 99%   BMI 47.27 kg/m   Physical Exam Vitals and nursing note reviewed.  Constitutional:      General: She is not in acute distress. HENT:     Head: Normocephalic and atraumatic.  Eyes:     Conjunctiva/sclera: Conjunctivae normal.     Pupils: Pupils are equal, round, and reactive to light.  Cardiovascular:     Rate and  Rhythm: Normal rate and regular rhythm.  Pulmonary:     Effort: Pulmonary effort is normal. No respiratory distress.  Abdominal:     General: There is no distension.     Tenderness: There is no guarding.  Musculoskeletal:        General: No deformity or signs of injury.     Cervical back: Neck supple.     Comments: 2+ radial pulses, no tenderness of the metacarpals of the hand, no evidence of deformity or trauma, no redness or swelling.  Pain about the MCP joint of the 4th and 5th digits on the left, pain with range of motion, no fusiform swelling of the digits  Skin:    Findings: No lesion or rash.  Neurological:     General: No focal deficit present.     Mental Status: She is alert. Mental status is at baseline.     (all labs ordered are listed, but only abnormal results are displayed) Labs Reviewed  CBC WITH DIFFERENTIAL/PLATELET - Abnormal; Notable for the following components:      Result Value   Hemoglobin 11.9 (*)    All other components within normal limits  BASIC METABOLIC PANEL WITH GFR - Abnormal; Notable for the following components:   Glucose,  Bld 101 (*)    All other components within normal limits  PREGNANCY, URINE  SEDIMENTATION RATE  C-REACTIVE PROTEIN    EKG: None  Radiology: DG Hand Complete Left Result Date: 05/17/2024 CLINICAL DATA:  pinky and ring finger pain EXAM: LEFT HAND - COMPLETE 3+ VIEW COMPARISON:  None Available. FINDINGS: There is no evidence of fracture or dislocation. There is no evidence of arthropathy or other focal bone abnormality. Soft tissues are unremarkable. IMPRESSION: Negative. Electronically Signed   By: Morgane  Naveau M.D.   On: 05/17/2024 20:49     Procedures   Medications Ordered in the ED  ibuprofen  (ADVIL ) tablet 800 mg (800 mg Oral Given 05/17/24 2040)                                    Medical Decision Making Amount and/or Complexity of Data Reviewed Labs: ordered. Radiology: ordered.  Risk Prescription drug  management.      28 year old female presenting to the emergency department with pain that has been present in her fingers.  The patient is right-handed.  She works at [employer].  She states that she has had no trauma to her fingers.  She developed pain in her distal left ring finger that radiated to her wrist, now endorsing pain in her left pinky finger as well.  No fevers, chills, redness or swelling.  She denies any elbow pain.  Pain does not radiate from her elbow down to her hand.  Pain has been ongoing for the past 2 weeks.  Worse with any attempts at range of motion.  No significant joint swelling.  On arrival, the patient was vitally stable, physical exam revealed 2+ radial pulses, no tenderness of the metacarpals of the hand, no evidence of deformity or trauma, no redness or swelling.  Pain about the MCP joint of the 4th and 5th digits on the left, pain with range of motion, no fusiform swelling of the digits.  Differential diagnosis includes overuse tendinitis or tendinopathy, finger sprain or strain although patient denies any injury, osteoarthritis, inflammatory arthritis, less likely ulnar nerve entrapment syndrome, less likely trigger finger.  X-ray left hand: Negative   Labs: CBC without a leukocytosis, hemoglobin at baseline anemia 11.9, BMP unremarkable, urine pregnancy negative, ESR and CRP pending  Patient administered Motrin  for pain control.  Patient offered a wrist splint but declined, requesting that we buddy tape her fingers which was done.  Patient overall reassuring workup at this time, low concern for septic arthritis, no evidence of traumatic injury to the hand, considered inflammatory arthritis, ESR and CRP pending.  Also considered tendinitis.  Advised NSAIDs for pain control and outpatient PCP follow-up.     Final diagnoses:  Pain of left hand  Tendinitis    ED Discharge Orders          Ordered    AMB referral to sports medicine        05/17/24 2146                Jerrol Agent, MD 05/17/24 2146

## 2024-05-17 NOTE — ED Notes (Signed)
 Pt was instructed of MD orders to wear the wrsit brace as to assist in preventing flexion of the fingers and worsening pain

## 2024-05-17 NOTE — ED Triage Notes (Signed)
 Pt states for 2 weeks has had pain in distal left ring finger  States worse this am and now left pinky finger now hurting and radiating to wrist

## 2024-05-17 NOTE — ED Notes (Signed)
 Arrived to place wrist brace on pt, she notes that she has these in excess at home and would not like one today, also states the wrist brace isnt going to help her fingers, this medic offered to notify MD and request possibly buddy tapping the fingers.  MD aware

## 2024-05-17 NOTE — Discharge Instructions (Addendum)
 Your labs and x-ray imaging were overall reassuring.  Your symptoms could be due to tendinitis affecting the 4th and 5th digits of the left hand.  No evidence for infection at this time.  Recommend rest, ice, elevation of the extremity, NSAIDs for pain control, utilize a splint as needed for comfort, follow-up outpatient with sports medicine for repeat assessment.

## 2024-05-17 NOTE — ED Notes (Signed)
 Pt refused the wrist splint advising she has hundreds of them at home. Requested buddy taping her fingers together. EDP aware and approved.

## 2024-10-12 ENCOUNTER — Emergency Department (HOSPITAL_BASED_OUTPATIENT_CLINIC_OR_DEPARTMENT_OTHER)
Admission: EM | Admit: 2024-10-12 | Discharge: 2024-10-12 | Disposition: A | Discharge status: Home / Self Care | Attending: Emergency Medicine | Admitting: Emergency Medicine

## 2024-10-12 ENCOUNTER — Encounter (HOSPITAL_BASED_OUTPATIENT_CLINIC_OR_DEPARTMENT_OTHER): Payer: Self-pay

## 2024-10-12 ENCOUNTER — Other Ambulatory Visit: Payer: Self-pay

## 2024-10-12 ENCOUNTER — Emergency Department (HOSPITAL_BASED_OUTPATIENT_CLINIC_OR_DEPARTMENT_OTHER)

## 2024-10-12 DIAGNOSIS — R519 Headache, unspecified: Secondary | ICD-10-CM | POA: Diagnosis present

## 2024-10-12 LAB — COMPREHENSIVE METABOLIC PANEL WITH GFR
ALT: 15 U/L (ref 0–44)
AST: 18 U/L (ref 15–41)
Albumin: 4.1 g/dL (ref 3.5–5.0)
Alkaline Phosphatase: 91 U/L (ref 38–126)
Anion gap: 9 (ref 5–15)
BUN: 10 mg/dL (ref 6–20)
CO2: 28 mmol/L (ref 22–32)
Calcium: 8.8 mg/dL — ABNORMAL LOW (ref 8.9–10.3)
Chloride: 102 mmol/L (ref 98–111)
Creatinine, Ser: 0.76 mg/dL (ref 0.44–1.00)
GFR, Estimated: >60 mL/min (ref >60)
Glucose, Bld: 89 mg/dL (ref 70–99)
Potassium: 4 mmol/L (ref 3.5–5.1)
Sodium: 139 mmol/L (ref 135–145)
Total Bilirubin: 0.5 mg/dL (ref 0.0–1.2)
Total Protein: 7.5 g/dL (ref 6.5–8.1)

## 2024-10-12 LAB — CBC WITH DIFFERENTIAL/PLATELET
Abs Immature Granulocytes: 0.01 K/uL (ref 0.00–0.07)
Basophils Absolute: 0 K/uL (ref 0.0–0.1)
Basophils Relative: 0 %
Eosinophils Absolute: 0.1 K/uL (ref 0.0–0.5)
Eosinophils Relative: 1 %
HCT: 37.9 % (ref 36.0–46.0)
Hemoglobin: 12.5 g/dL (ref 12.0–15.0)
Immature Granulocytes: 0 %
Lymphocytes Relative: 36 %
Lymphs Abs: 2.7 K/uL (ref 0.7–4.0)
MCH: 27.8 pg (ref 26.0–34.0)
MCHC: 33 g/dL (ref 30.0–36.0)
MCV: 84.2 fL (ref 80.0–100.0)
Monocytes Absolute: 0.5 K/uL (ref 0.1–1.0)
Monocytes Relative: 7 %
Neutro Abs: 4.1 K/uL (ref 1.7–7.7)
Neutrophils Relative %: 56 %
Platelets: 296 K/uL (ref 150–400)
RBC: 4.5 MIL/uL (ref 3.87–5.11)
RDW: 14.7 % (ref 11.5–15.5)
WBC: 7.4 K/uL (ref 4.0–10.5)
nRBC: 0 % (ref 0.0–0.2)

## 2024-10-12 LAB — PREGNANCY, URINE: Preg Test, Ur: NEGATIVE

## 2024-10-12 MED ORDER — PROCHLORPERAZINE EDISYLATE 10 MG/2ML IJ SOLN
5.0000 mg | Freq: Once | INTRAMUSCULAR | Status: AC
  Administered 2024-10-12: 16:00:00 5 mg via INTRAVENOUS
  Filled 2024-10-12: qty 2

## 2024-10-12 MED ORDER — DEXAMETHASONE SOD PHOSPHATE PF 10 MG/ML IJ SOLN
10.0000 mg | Freq: Once | INTRAMUSCULAR | Status: AC
  Administered 2024-10-12: 16:00:00 10 mg via INTRAVENOUS

## 2024-10-12 MED ORDER — SODIUM CHLORIDE 0.9 % IV BOLUS
1000.0000 mL | Freq: Once | INTRAVENOUS | Status: AC
  Administered 2024-10-12: 16:00:00 1000 mL via INTRAVENOUS

## 2024-10-12 MED ORDER — DIPHENHYDRAMINE HCL 50 MG/ML IJ SOLN
12.5000 mg | Freq: Once | INTRAMUSCULAR | Status: AC
  Administered 2024-10-12: 16:00:00 12.5 mg via INTRAVENOUS
  Filled 2024-10-12: qty 1

## 2024-10-12 NOTE — Discharge Instructions (Addendum)
 Lab work and imaging was overall reassuring today.  Please follow-up with neurology and return to emergency room with new or worsening symptoms.

## 2024-10-12 NOTE — ED Notes (Signed)
 Patient transferred from waiting room to ED treatment room. Assuming pt care at this time.

## 2024-10-12 NOTE — ED Triage Notes (Signed)
 C/o headaches x 1 week. States had blood spot in her eye 3 days ago which has improved. States headaches are intermittent. Light sensitivity. Denies hx of same.   C/o headache in triage that started 30 min pta.

## 2024-10-12 NOTE — ED Provider Notes (Signed)
 [facility] Provider Note   CSN: [id number] Arrival date & time: 10/12/24  1455     Patient presents with: Headache   Chelsea Newton is a 28 y.o. female patient with past medical history of obesity, allergy presents to emergency room with complaint of headache.  Patient reports that over the last week she has started having daily headache.  Patient reports that they start around noon and seem to get gradually worse throughout the day.  Headache wraps around her entire head.  Does report associated brain fog.  She denies any injury or fall.  No sudden onset or severe headache.  No vision change, confusion or weakness/tingling. Has tried Tylenol  without improvement. Also has 3 days of right sided subconjunctival hemorrhage.     Headache      Prior to Admission medications  Medication Sig Start Date End Date Taking? Authorizing Provider  ibuprofen  (ADVIL ) 100 MG/5ML suspension Take 30 mLs (600 mg total) by mouth every 6 (six) hours as needed for fever, mild pain or moderate pain. 03/05/23   Geiple, Joshua, PA-C  naproxen  (NAPROSYN ) 375 MG tablet Take 1 tablet (375 mg total) by mouth 2 (two) times daily with a meal. 12/31/23   Arloa Chroman, PA-C    Allergies: Patient has no known allergies.    Review of Systems  Neurological:  Positive for headaches.    Updated Vital Signs BP (!) 159/97 (BP Location: Right Wrist)   Pulse 94   Temp 98.3 F (36.8 C) (Oral)   Resp 19   Ht 5' 9 (1.753 m)   Wt (!) 149.7 kg   LMP 09/14/2024 (Approximate)   SpO2 100%   BMI 48.73 kg/m   Physical Exam Vitals and nursing note reviewed.  Constitutional:      General: She is not in acute distress.    Appearance: She is not toxic-appearing.  HENT:     Head: Normocephalic and atraumatic.  Eyes:     General: No visual field deficit or scleral icterus.    Conjunctiva/sclera: Conjunctivae normal.     Comments: Right sided subconjunctival hemorrhage.     Cardiovascular:     Rate and Rhythm: Normal rate and regular rhythm.     Pulses: Normal pulses.     Heart sounds: Normal heart sounds.  Pulmonary:     Effort: Pulmonary effort is normal. No respiratory distress.     Breath sounds: Normal breath sounds.  Abdominal:     General: Abdomen is flat. Bowel sounds are normal.     Palpations: Abdomen is soft.     Tenderness: There is no abdominal tenderness.  Skin:    General: Skin is warm and dry.     Findings: No lesion.  Neurological:     General: No focal deficit present.     Mental Status: She is alert and oriented to person, place, and time. Mental status is at baseline.     GCS: GCS eye subscore is 4. GCS verbal subscore is 5. GCS motor subscore is 6.     Cranial Nerves: No cranial nerve deficit, dysarthria or facial asymmetry.     Sensory: No sensory deficit.     Motor: No weakness.     Coordination: Coordination normal.     Gait: Gait normal.     Comments: Pupils are equal and reactive no slurred speech.  No focal deficit on exam.  Psychiatric:        Mood and Affect: Mood normal.  Speech: Speech normal.        Behavior: Behavior normal.     (all labs ordered are listed, but only abnormal results are displayed) Labs Reviewed  PREGNANCY, URINE  CBC WITH DIFFERENTIAL/PLATELET  COMPREHENSIVE METABOLIC PANEL WITH GFR    EKG: None  Radiology: No results found.   Procedures   Medications Ordered in the ED  prochlorperazine  (COMPAZINE ) injection 5 mg (has no administration in time range)  diphenhydrAMINE  (BENADRYL ) injection 12.5 mg (has no administration in time range)  dexamethasone  (DECADRON ) injection 10 mg (has no administration in time range)  sodium chloride  0.9 % bolus 1,000 mL (has no administration in time range)                                    Medical Decision Making Amount and/or Complexity of Data Reviewed Labs: ordered. Radiology: ordered.  Risk Prescription drug management.   This  patient presents to the ED for concern of headache, this involves an extensive number of treatment options, and is a complaint that carries with it a high risk of complications and morbidity.  The differential diagnosis includes tension headache, migraine, intracranial mass, intracranial hemorrhage, intracranial infection including meningitis vs encephalitis, trigeminal neuralgia, AVM, sinusitis, cerebral aneurysm, muscular headache, cavernous sinus thrombosis, carotid artery dissection.   Lab Tests:  I personally interpreted labs.  The pertinent results include:   CBC, CMP pregnancy test negative   Imaging Studies ordered:  I ordered imaging studies including head CT I independently visualized and interpreted imaging which showed no acute findings no acute findings.  I agree with the radiologist interpretation   Problem List / ED Course / Critical interventions / Medication management  Patient presents to emergency room with 1 week of headaches.  This started toward midday and get gradually worse.  Denies any injury trauma or fall.  Stable and well-appearing.  No focal neurological deficit. Symptoms are not consistent with TIA or CVA. No meningismus or sign of systemic illness/fever.  I did check CBC, CMP which were unremarkable.  Pregnancy test negative.  I did obtain CT of the head which shows no acute findings. I ordered medication including migraine cocktail given for headache  Reevaluation of the patient after these medicines showed that the patient improved I have reviewed the patients home medicines and have made adjustments as needed. Overall reassuring workup here.  Feeling better after treatment.  Vitals are stable feel appropriate for discharge with outpatient follow-up.  Will give neurology for new daily persistent headaches.        Final diagnoses:  Bad headache    ED Discharge Orders          Ordered    Ambulatory referral to Neurology       Comments: An  appointment is requested in approximately: 4 weeks   10/12/24 1629               Jadalee Westcott, Warren SAILOR, PA-C 10/12/24 1733    Lenor Hollering, MD 10/12/24 1910
